# Patient Record
Sex: Female | Born: 1990 | Race: Black or African American | Hispanic: No | Marital: Single | Smoking: Never smoker
Health system: Southern US, Community
[De-identification: ages and names within clinical notes are randomized; demographics above are authoritative.]

## PROBLEM LIST (undated history)

## (undated) ENCOUNTER — Inpatient Hospital Stay (HOSPITAL_COMMUNITY): Payer: Self-pay

## (undated) DIAGNOSIS — D6800 Von Willebrand disease, unspecified: Secondary | ICD-10-CM

## (undated) HISTORY — PX: WISDOM TOOTH EXTRACTION: SHX21

---
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
## 2015-09-14 NOTE — L&D Delivery Note (Signed)
Operative Delivery Note At 4:55 PM a viable female was delivered via Vaginal, Vacuum Investment banker, operational(Extractor).  Presentation: vertex; Position: Occiput,, Anterior; Station: +2.  Verbal consent: obtained from patient.  Risks and benefits discussed in detail.  Risks include, but are not limited to the risks of anesthesia, bleeding, infection, damage to maternal tissues, fetal cephalhematoma.  There is also the risk of inability to effect vaginal delivery of the head, or shoulder dystocia that cannot be resolved by established maneuvers, leading to the need for emergency cesarean section... Vacuum was placed and head was delivered successfully  with 3 contractions. Pressure was released between contractions. No pop offs occurred.   APGAR: 8 and 9, ; weight  Pending .   Placenta status: intact , .   Cord:nuchal cord x1   with the following complications: None.  Cord pH: pending.  Anesthesia:  Epidural  Instruments: Kiwi Vacuum Extractor  Episiotomy: Right Mediolateral Lacerations: None Suture Repair: 3.0 vicryl Est. Blood Loss (mL): 250  Mom to postpartum.  Baby to Couplet care / Skin to Skin  Given patients diagnosis of Von Willebrands Disease plan to avoid circumcision until pediatrician has cleared infant for circumcision.  Cloma Rahrig J. 05/12/2016, 6:23 PM

## 2015-10-02 DIAGNOSIS — N898 Other specified noninflammatory disorders of vagina: Secondary | ICD-10-CM | POA: Diagnosis not present

## 2015-10-02 DIAGNOSIS — Z331 Pregnant state, incidental: Secondary | ICD-10-CM | POA: Diagnosis not present

## 2015-10-06 DIAGNOSIS — Z3401 Encounter for supervision of normal first pregnancy, first trimester: Secondary | ICD-10-CM | POA: Diagnosis not present

## 2015-10-06 LAB — OB RESULTS CONSOLE RUBELLA ANTIBODY, IGM: Rubella: IMMUNE

## 2015-10-06 LAB — OB RESULTS CONSOLE HIV ANTIBODY (ROUTINE TESTING): HIV: NONREACTIVE

## 2015-10-06 LAB — OB RESULTS CONSOLE ABO/RH: RH Type: POSITIVE

## 2015-10-06 LAB — OB RESULTS CONSOLE RPR: RPR: NONREACTIVE

## 2015-10-06 LAB — OB RESULTS CONSOLE HEPATITIS B SURFACE ANTIGEN: HEP B S AG: NEGATIVE

## 2015-10-06 LAB — OB RESULTS CONSOLE ANTIBODY SCREEN: Antibody Screen: NEGATIVE

## 2015-10-07 DIAGNOSIS — O26899 Other specified pregnancy related conditions, unspecified trimester: Secondary | ICD-10-CM | POA: Diagnosis not present

## 2015-10-07 DIAGNOSIS — Z3A08 8 weeks gestation of pregnancy: Secondary | ICD-10-CM | POA: Diagnosis not present

## 2015-10-21 ENCOUNTER — Other Ambulatory Visit (HOSPITAL_COMMUNITY)
Admission: RE | Admit: 2015-10-21 | Discharge: 2015-10-21 | Disposition: A | Discharge status: Home / Self Care | Payer: 59 | Source: Ambulatory Visit | Attending: Obstetrics and Gynecology | Admitting: Obstetrics and Gynecology

## 2015-10-21 ENCOUNTER — Other Ambulatory Visit: Payer: Self-pay | Admitting: Obstetrics and Gynecology

## 2015-10-21 DIAGNOSIS — Z01419 Encounter for gynecological examination (general) (routine) without abnormal findings: Secondary | ICD-10-CM | POA: Diagnosis not present

## 2015-10-21 DIAGNOSIS — Z113 Encounter for screening for infections with a predominantly sexual mode of transmission: Secondary | ICD-10-CM | POA: Insufficient documentation

## 2015-10-23 LAB — CYTOLOGY - PAP

## 2015-11-18 DIAGNOSIS — Z3402 Encounter for supervision of normal first pregnancy, second trimester: Secondary | ICD-10-CM | POA: Diagnosis not present

## 2015-12-15 ENCOUNTER — Emergency Department (HOSPITAL_BASED_OUTPATIENT_CLINIC_OR_DEPARTMENT_OTHER)
Admission: EM | Admit: 2015-12-15 | Discharge: 2015-12-15 | Disposition: A | Discharge status: Home / Self Care | Payer: 59 | Attending: Emergency Medicine | Admitting: Emergency Medicine

## 2015-12-15 ENCOUNTER — Encounter (HOSPITAL_BASED_OUTPATIENT_CLINIC_OR_DEPARTMENT_OTHER): Payer: Self-pay | Admitting: Emergency Medicine

## 2015-12-15 DIAGNOSIS — Z79899 Other long term (current) drug therapy: Secondary | ICD-10-CM | POA: Insufficient documentation

## 2015-12-15 DIAGNOSIS — O210 Mild hyperemesis gravidarum: Secondary | ICD-10-CM | POA: Insufficient documentation

## 2015-12-15 DIAGNOSIS — Z3A18 18 weeks gestation of pregnancy: Secondary | ICD-10-CM | POA: Insufficient documentation

## 2015-12-15 DIAGNOSIS — R112 Nausea with vomiting, unspecified: Secondary | ICD-10-CM | POA: Diagnosis not present

## 2015-12-15 DIAGNOSIS — R1013 Epigastric pain: Secondary | ICD-10-CM | POA: Insufficient documentation

## 2015-12-15 DIAGNOSIS — O9989 Other specified diseases and conditions complicating pregnancy, childbirth and the puerperium: Secondary | ICD-10-CM | POA: Diagnosis not present

## 2015-12-15 DIAGNOSIS — O219 Vomiting of pregnancy, unspecified: Secondary | ICD-10-CM | POA: Diagnosis present

## 2015-12-15 DIAGNOSIS — Z862 Personal history of diseases of the blood and blood-forming organs and certain disorders involving the immune mechanism: Secondary | ICD-10-CM | POA: Insufficient documentation

## 2015-12-15 DIAGNOSIS — R1110 Vomiting, unspecified: Secondary | ICD-10-CM

## 2015-12-15 HISTORY — DX: Von Willebrand's disease: D68.0

## 2015-12-15 HISTORY — DX: Von Willebrand disease, unspecified: D68.00

## 2015-12-15 LAB — URINALYSIS, ROUTINE W REFLEX MICROSCOPIC
Bilirubin Urine: NEGATIVE
GLUCOSE, UA: NEGATIVE mg/dL
HGB URINE DIPSTICK: NEGATIVE
Ketones, ur: 15 mg/dL — AB
Nitrite: NEGATIVE
Protein, ur: 30 mg/dL — AB
SPECIFIC GRAVITY, URINE: 1.027 (ref 1.005–1.030)
pH: 6 (ref 5.0–8.0)

## 2015-12-15 LAB — URINE MICROSCOPIC-ADD ON: RBC / HPF: NONE SEEN RBC/hpf (ref 0–5)

## 2015-12-15 MED ORDER — ONDANSETRON 4 MG PO TBDP
4.0000 mg | ORAL_TABLET | Freq: Once | ORAL | Status: AC
  Administered 2015-12-15: 4 mg via ORAL
  Filled 2015-12-15: qty 1

## 2015-12-15 MED ORDER — ONDANSETRON HCL 4 MG PO TABS: 4.0000 mg | ORAL_TABLET | Freq: Four times a day (QID) | ORAL | Status: DC

## 2015-12-15 NOTE — ED Notes (Signed)
MD at bedside. 

## 2015-12-15 NOTE — ED Notes (Signed)
abd ultrasound by EDP, FHR by EDP calculated at 136bpm

## 2015-12-15 NOTE — ED Notes (Signed)
Pt tolerating PO's well.  

## 2015-12-15 NOTE — ED Notes (Signed)
Pt resting quietly, states feels better after PO med

## 2015-12-15 NOTE — ED Notes (Signed)
Pt reports that she vomited x 5 since 5 am this morning, reports going to Children[facility] party yesterday and was fine prior to that

## 2015-12-15 NOTE — ED Provider Notes (Addendum)
CSN: [id number]     Arrival date & time 12/15/15  0856 History   First MD Initiated Contact with Patient 12/15/15 0913     Chief Complaint  Patient presents with  . Emesis     (Consider location/radiation/quality/duration/timing/severity/associated sxs/prior Treatment) Patient is a 25 y.o. female presenting with vomiting. The history is provided by the patient.  Emesis Severity:  Severe Duration:  4 hours Timing:  Intermittent Number of daily episodes:  5 Quality:  Stomach contents Progression:  Improving Chronicity:  New Recent urination:  Normal Relieved by:  None tried Worsened by:  Liquids and ice chips Ineffective treatments:  None tried Associated symptoms: abdominal pain   Associated symptoms: no cough, no diarrhea, no fever, no headaches, no sore throat and no URI   Associated symptoms comment:  Patient is currently [redacted] weeks pregnant with her first pregnancy. She did not have issues with morning sickness during her pregnancy. She felt fine yesterday but woke up at 5 AM this morning with vomiting every 30 minutes. Last episode of emesis was approximately 1 hour ago. She states after the vomiting started she started to have some abdominal soreness. Risk factors: pregnant now   Risk factors: no alcohol use, no diabetes, no prior abdominal surgery and no sick contacts     Past Medical History  Diagnosis Date  . Von Willebrand disease (HCC)    History reviewed. No pertinent past surgical history. History reviewed. No pertinent family history. Social History  Substance Use Topics  . Smoking status: Never Smoker   . Smokeless tobacco: None  . Alcohol Use: No   OB History    Gravida Para Term Preterm AB TAB SAB Ectopic Multiple Living   1              Review of Systems  HENT: Negative for sore throat.   Gastrointestinal: Positive for vomiting and abdominal pain. Negative for diarrhea.  Neurological: Negative for headaches.  All other systems reviewed and are  negative.     Allergies  Review of patient's allergies indicates no known allergies.  Home Medications   Prior to Admission medications   Medication Sig Start Date End Date Taking? Authorizing Provider  Prenatal Vit-Fe Fumarate-FA (MULTIVITAMIN-PRENATAL) 27-0.8 MG TABS tablet Take 1 tablet by mouth daily at 12 noon.   Yes Historical Provider, MD   BP 120/75 mmHg  Pulse 94  Temp(Src) 97.5 F (36.4 C) (Oral)  Resp 16  Ht 5\' 2"  (1.575 m)  Wt 125 lb (56.7 kg)  BMI 22.86 kg/m2  SpO2 100% Physical Exam  Constitutional: She is oriented to person, place, and time. She appears well-developed and well-nourished. No distress.  HENT:  Head: Normocephalic and atraumatic.  Mouth/Throat: Oropharynx is clear and moist.  Eyes: Conjunctivae and EOM are normal. Pupils are equal, round, and reactive to light.  Neck: Normal range of motion. Neck supple.  Cardiovascular: Normal rate, regular rhythm and intact distal pulses.   No murmur heard. Pulmonary/Chest: Effort normal and breath sounds normal. No respiratory distress. She has no wheezes. She has no rales.  Abdominal: Soft. She exhibits no distension. There is tenderness in the epigastric area and suprapubic area. There is no rebound and no guarding.  Gravid abdomen to above the pubic symphysis. No rebound or guarding but tenderness in the suprapubic and epigastric areas. abdomen is soft  Musculoskeletal: Normal range of motion. She exhibits no edema or tenderness.  Neurological: She is alert and oriented to person, place, and time.  Skin: Skin  is warm and dry. No rash noted. No erythema.  Psychiatric: She has a normal mood and affect. Her behavior is normal.  Nursing note and vitals reviewed.   ED Course  Procedures (including critical care time) Labs Review Labs Reviewed  URINALYSIS, ROUTINE W REFLEX MICROSCOPIC (NOT AT [facility]) - Abnormal; Notable for the following:    Color, Urine AMBER (*)    APPearance CLOUDY (*)    Ketones, ur 15  (*)    Protein, ur 30 (*)    Leukocytes, UA SMALL (*)    All other components within normal limits  URINE MICROSCOPIC-ADD ON - Abnormal; Notable for the following:    Squamous Epithelial / LPF 6-30 (*)    Bacteria, UA MANY (*)    All other components within normal limits    Imaging Review No results found. I have personally reviewed and evaluated these images and lab results as part of my medical decision-making.   EKG Interpretation None      MDM   Final diagnoses:  Intractable vomiting with nausea, vomiting of unspecified type    Patient is a healthy 25 year old female who is currently [redacted] weeks pregnant with a history of von Willebrand's disease presents today for 5 episodes of vomiting since 5 AM. She is still complaining of nausea now is having diffuse abdominal tenderness. She denies any urinary symptoms, vaginal discharge or bleeding, cough, shortness of breath. The vomiting started first and then the abdominal tenderness.  On exam bedside ultrasound shows a fetal heart rate of 136 with normal fetal movement. Patient has an appointment with her OB tomorrow for anatomy scan.  Vital signs and blood pressure within normal limits today. Feel most likely patient has a GI bug. However given the diffuse abdominal tenderness will check a UA as its most pronounced in the suprapubic region. Low suspicion for appendicitis, cholecystitis, pancreatitis or peritonitis at this time.  Patient given ODT Zofran and will orally rehydrate.  9:59 AM Patient's urine is contaminated but no obvious signs of infection.   10:49 AM Issues feeling much better after Zofran. Tolerating by mouth's and crackers. Will discharge home and has follow-up with her OB tomorrow.  Gwyneth Sprout, MD 12/15/15 1050  Gwyneth Sprout, MD 12/15/15 1051

## 2015-12-16 DIAGNOSIS — Z36 Encounter for antenatal screening of mother: Secondary | ICD-10-CM | POA: Diagnosis not present

## 2015-12-16 DIAGNOSIS — Z3402 Encounter for supervision of normal first pregnancy, second trimester: Secondary | ICD-10-CM | POA: Diagnosis not present

## 2016-02-12 DIAGNOSIS — Z3401 Encounter for supervision of normal first pregnancy, first trimester: Secondary | ICD-10-CM | POA: Diagnosis not present

## 2016-02-12 DIAGNOSIS — Z3402 Encounter for supervision of normal first pregnancy, second trimester: Secondary | ICD-10-CM | POA: Diagnosis not present

## 2016-03-24 DIAGNOSIS — Z3A32 32 weeks gestation of pregnancy: Secondary | ICD-10-CM | POA: Diagnosis not present

## 2016-03-24 DIAGNOSIS — O26843 Uterine size-date discrepancy, third trimester: Secondary | ICD-10-CM | POA: Diagnosis not present

## 2016-04-06 DIAGNOSIS — Z3403 Encounter for supervision of normal first pregnancy, third trimester: Secondary | ICD-10-CM | POA: Diagnosis not present

## 2016-04-06 DIAGNOSIS — Z23 Encounter for immunization: Secondary | ICD-10-CM | POA: Diagnosis not present

## 2016-04-22 DIAGNOSIS — Z3403 Encounter for supervision of normal first pregnancy, third trimester: Secondary | ICD-10-CM | POA: Diagnosis not present

## 2016-04-22 DIAGNOSIS — R030 Elevated blood-pressure reading, without diagnosis of hypertension: Secondary | ICD-10-CM | POA: Diagnosis not present

## 2016-04-22 DIAGNOSIS — O26843 Uterine size-date discrepancy, third trimester: Secondary | ICD-10-CM | POA: Diagnosis not present

## 2016-04-22 LAB — OB RESULTS CONSOLE GC/CHLAMYDIA
CHLAMYDIA, DNA PROBE: NEGATIVE
GC PROBE AMP, GENITAL: NEGATIVE

## 2016-04-22 LAB — OB RESULTS CONSOLE GBS: GBS: NEGATIVE

## 2016-04-27 DIAGNOSIS — Z3A37 37 weeks gestation of pregnancy: Secondary | ICD-10-CM | POA: Diagnosis not present

## 2016-04-27 DIAGNOSIS — O26843 Uterine size-date discrepancy, third trimester: Secondary | ICD-10-CM | POA: Diagnosis not present

## 2016-05-03 DIAGNOSIS — O368131 Decreased fetal movements, third trimester, fetus 1: Secondary | ICD-10-CM | POA: Diagnosis not present

## 2016-05-03 DIAGNOSIS — Z3403 Encounter for supervision of normal first pregnancy, third trimester: Secondary | ICD-10-CM | POA: Diagnosis not present

## 2016-05-11 ENCOUNTER — Encounter (HOSPITAL_COMMUNITY): Payer: Self-pay | Admitting: *Deleted

## 2016-05-11 ENCOUNTER — Inpatient Hospital Stay (HOSPITAL_COMMUNITY)
Admission: AD | Admit: 2016-05-11 | Discharge: 2016-05-14 | DRG: 775 | Disposition: A | Discharge status: Home / Self Care | Payer: 59 | Source: Ambulatory Visit | Attending: Obstetrics and Gynecology | Admitting: Obstetrics and Gynecology

## 2016-05-11 ENCOUNTER — Other Ambulatory Visit (HOSPITAL_COMMUNITY): Payer: Self-pay | Admitting: Obstetrics and Gynecology

## 2016-05-11 DIAGNOSIS — Z3A39 39 weeks gestation of pregnancy: Secondary | ICD-10-CM | POA: Diagnosis not present

## 2016-05-11 DIAGNOSIS — O134 Gestational [pregnancy-induced] hypertension without significant proteinuria, complicating childbirth: Principal | ICD-10-CM | POA: Diagnosis present

## 2016-05-11 DIAGNOSIS — O9912 Other diseases of the blood and blood-forming organs and certain disorders involving the immune mechanism complicating childbirth: Secondary | ICD-10-CM | POA: Diagnosis present

## 2016-05-11 DIAGNOSIS — O1490 Unspecified pre-eclampsia, unspecified trimester: Secondary | ICD-10-CM | POA: Diagnosis present

## 2016-05-11 DIAGNOSIS — D6800 Von Willebrand disease, unspecified: Secondary | ICD-10-CM | POA: Diagnosis present

## 2016-05-11 LAB — CBC
HEMATOCRIT: 43.8 % (ref 36.0–46.0)
Hemoglobin: 14.7 g/dL (ref 12.0–15.0)
MCH: 29.9 pg (ref 26.0–34.0)
MCHC: 33.6 g/dL (ref 30.0–36.0)
MCV: 89.2 fL (ref 78.0–100.0)
Platelets: 253 10*3/uL (ref 150–400)
RBC: 4.91 MIL/uL (ref 3.87–5.11)
RDW: 13.2 % (ref 11.5–15.5)
WBC: 8.7 10*3/uL (ref 4.0–10.5)

## 2016-05-11 LAB — FIBRINOGEN: Fibrinogen: 504 mg/dL — ABNORMAL HIGH (ref 210–475)

## 2016-05-11 LAB — TYPE AND SCREEN
ABO/RH(D): O POS
Antibody Screen: NEGATIVE

## 2016-05-11 LAB — COMPREHENSIVE METABOLIC PANEL WITH GFR
ALT: 19 U/L (ref 14–54)
AST: 24 U/L (ref 15–41)
Albumin: 4.3 g/dL (ref 3.5–5.0)
Alkaline Phosphatase: 170 U/L — ABNORMAL HIGH (ref 38–126)
Anion gap: 9 (ref 5–15)
BUN: 7 mg/dL (ref 6–20)
CO2: 23 mmol/L (ref 22–32)
Calcium: 10 mg/dL (ref 8.9–10.3)
Chloride: 104 mmol/L (ref 101–111)
Creatinine, Ser: 0.45 mg/dL (ref 0.44–1.00)
GFR calc Af Amer: >60 mL/min
GFR calc non Af Amer: >60 mL/min
Glucose, Bld: 92 mg/dL (ref 65–99)
Potassium: 4 mmol/L (ref 3.5–5.1)
Sodium: 136 mmol/L (ref 135–145)
Total Bilirubin: 1.8 mg/dL — ABNORMAL HIGH (ref 0.3–1.2)
Total Protein: 8.4 g/dL — ABNORMAL HIGH (ref 6.5–8.1)

## 2016-05-11 LAB — URIC ACID: URIC ACID, SERUM: 4.3 mg/dL (ref 2.3–6.6)

## 2016-05-11 LAB — PROTEIN / CREATININE RATIO, URINE
Creatinine, Urine: 70 mg/dL
PROTEIN CREATININE RATIO: 0.1 mg/mg{creat} (ref 0.00–0.15)
Total Protein, Urine: 7 mg/dL

## 2016-05-11 LAB — LACTATE DEHYDROGENASE: LDH: 183 U/L (ref 98–192)

## 2016-05-11 LAB — PROTIME-INR
INR: 0.82
Prothrombin Time: 11.3 seconds — ABNORMAL LOW (ref 11.4–15.2)

## 2016-05-11 LAB — APTT: aPTT: 32 s (ref 24–36)

## 2016-05-11 LAB — ABO/RH: ABO/RH(D): O POS

## 2016-05-11 MED ORDER — LIDOCAINE HCL (PF) 1 % IJ SOLN
30.0000 mL | INTRAMUSCULAR | Status: DC | PRN
  Filled 2016-05-11: qty 30

## 2016-05-11 MED ORDER — ONDANSETRON HCL 4 MG/2ML IJ SOLN
4.0000 mg | Freq: Four times a day (QID) | INTRAMUSCULAR | Status: DC | PRN
  Administered 2016-05-12: 4 mg via INTRAVENOUS
  Filled 2016-05-11: qty 2

## 2016-05-11 MED ORDER — OXYCODONE-ACETAMINOPHEN 5-325 MG PO TABS: 2.0000 | ORAL_TABLET | ORAL | Status: DC | PRN

## 2016-05-11 MED ORDER — ACETAMINOPHEN 325 MG PO TABS: 650.0000 mg | ORAL_TABLET | ORAL | Status: DC | PRN

## 2016-05-11 MED ORDER — LACTATED RINGERS IV SOLN
INTRAVENOUS | Status: DC
  Administered 2016-05-11 – 2016-05-12 (×2): via INTRAVENOUS

## 2016-05-11 MED ORDER — TERBUTALINE SULFATE 1 MG/ML IJ SOLN
0.2500 mg | Freq: Once | INTRAMUSCULAR | Status: DC | PRN
  Filled 2016-05-11: qty 1

## 2016-05-11 MED ORDER — FENTANYL CITRATE (PF) 100 MCG/2ML IJ SOLN: 50.0000 ug | INTRAMUSCULAR | Status: DC | PRN

## 2016-05-11 MED ORDER — SOD CITRATE-CITRIC ACID 500-334 MG/5ML PO SOLN: 30.0000 mL | ORAL | Status: DC | PRN

## 2016-05-11 MED ORDER — LACTATED RINGERS IV SOLN: 500.0000 mL | INTRAVENOUS | Status: DC | PRN

## 2016-05-11 MED ORDER — ZOLPIDEM TARTRATE 5 MG PO TABS: 5.0000 mg | ORAL_TABLET | Freq: Every evening | ORAL | Status: DC | PRN

## 2016-05-11 MED ORDER — OXYTOCIN 40 UNITS IN LACTATED RINGERS INFUSION - SIMPLE MED
2.5000 [IU]/h | INTRAVENOUS | Status: DC
  Administered 2016-05-12: 2.5 [IU]/h via INTRAVENOUS

## 2016-05-11 MED ORDER — OXYTOCIN 40 UNITS IN LACTATED RINGERS INFUSION - SIMPLE MED
1.0000 m[IU]/min | INTRAVENOUS | Status: DC
  Administered 2016-05-11: 1 m[IU]/min via INTRAVENOUS
  Filled 2016-05-11: qty 1000

## 2016-05-11 MED ORDER — OXYTOCIN BOLUS FROM INFUSION
500.0000 mL | Freq: Once | INTRAVENOUS | Status: AC
  Administered 2016-05-12: 500 mL via INTRAVENOUS

## 2016-05-11 MED ORDER — OXYCODONE-ACETAMINOPHEN 5-325 MG PO TABS: 1.0000 | ORAL_TABLET | ORAL | Status: DC | PRN

## 2016-05-11 NOTE — Anesthesia Pain Management Evaluation Note (Signed)
  CRNA Pain Management Visit Note  Patient: Karla [name], 25 y.o., female  "Hello I am a member of the anesthesia team at [facility]. We have an anesthesia team available at all times to provide care throughout the hospital, including epidural management and anesthesia for C-section. I don't know your plan for the delivery whether it a natural birth, water birth, IV sedation, nitrous supplementation, doula or epidural, but we want to meet your pain goals."   1.Was your pain managed to your expectations on prior hospitalizations?   No prior hospitalizations  2.What is your expectation for pain management during this hospitalization?     Epidural  3.How can we help you reach that goal? unsure  Record the patient's initial score and the patient's pain goal.   Pain: 0  Pain Goal: 6 [facility] wants you to be able to say your pain was always managed very well.  Cephus ShellingBURGER,Samarrah Tranchina 05/11/2016

## 2016-05-11 NOTE — Progress Notes (Addendum)
In to make the acquaintance of Karla [name], 25 yo G1P0 @ 39.1 wks admitted for preE w/o severe features.   Subjective: +FM. Denies h/a, visual changes, epigastric pain, difficulty breathing, ctxs, VB or LOF.  Objective: BP 134/83   Pulse 82   Temp 98.4 F (36.9 C) (Oral)   Resp 16   Ht 5\' 2"  (1.575 m)   Wt 70.8 kg (156 lb)   SpO2 100%   BMI 28.53 kg/m  No intake/output data recorded. No intake/output data recorded.  Today's Vitals   05/11/16 1849 05/11/16 1947 05/11/16 2004 05/11/16 2037  BP: 134/83     Pulse: 82     Resp:   16 16  Temp:   98.4 F (36.9 C)   TempSrc:   Oral   SpO2:      Weight:      Height:      PainSc: 0-No pain 0-No pain 0-No pain 0-No pain   Results for orders placed or performed during the hospital encounter of 05/11/16 (from the past 24 hour(s))  CBC     Status: None   Collection Time: 05/11/16  5:25 PM  Result Value Ref Range   WBC 8.7 4.0 - 10.5 K/uL   RBC 4.91 3.87 - 5.11 MIL/uL   Hemoglobin 14.7 12.0 - 15.0 g/dL   HCT 16.143.8 09.636.0 - 04.546.0 %   MCV 89.2 78.0 - 100.0 fL   MCH 29.9 26.0 - 34.0 pg   MCHC 33.6 30.0 - 36.0 g/dL   RDW 40.913.2 81.111.5 - 91.415.5 %   Platelets 253 150 - 400 K/uL  Comprehensive metabolic panel     Status: Abnormal   Collection Time: 05/11/16  5:25 PM  Result Value Ref Range   Sodium 136 135 - 145 mmol/L   Potassium 4.0 3.5 - 5.1 mmol/L   Chloride 104 101 - 111 mmol/L   CO2 23 22 - 32 mmol/L   Glucose, Bld 92 65 - 99 mg/dL   BUN 7 6 - 20 mg/dL   Creatinine, Ser 7.820.45 0.44 - 1.00 mg/dL   Calcium 95.610.0 8.9 - 21.310.3 mg/dL   Total Protein 8.4 (H) 6.5 - 8.1 g/dL   Albumin 4.3 3.5 - 5.0 g/dL   AST 24 15 - 41 U/L   ALT 19 14 - 54 U/L   Alkaline Phosphatase 170 (H) 38 - 126 U/L   Total Bilirubin 1.8 (H) 0.3 - 1.2 mg/dL   GFR calc non Af Amer >60 >60 mL/min   GFR calc Af Amer >60 >60 mL/min   Anion gap 9 5 - 15  Lactate dehydrogenase     Status: None   Collection Time: 05/11/16  5:25 PM  Result Value Ref Range   LDH 183 98 -  192 U/L  Type and screen     Status: None   Collection Time: 05/11/16  5:25 PM  Result Value Ref Range   ABO/RH(D) O POS    Antibody Screen NEG    Sample Expiration 05/14/2016   Uric acid     Status: None   Collection Time: 05/11/16  5:25 PM  Result Value Ref Range   Uric Acid, Serum 4.3 2.3 - 6.6 mg/dL  APTT     Status: None   Collection Time: 05/11/16  5:25 PM  Result Value Ref Range   aPTT 32 24 - 36 seconds  Protime-INR     Status: Abnormal   Collection Time: 05/11/16  5:25 PM  Result Value Ref Range  Prothrombin Time 11.3 (L) 11.4 - 15.2 seconds   INR 0.82   Fibrinogen     Status: Abnormal   Collection Time: 05/11/16  5:25 PM  Result Value Ref Range   Fibrinogen 504 (H) 210 - 475 mg/dL  ABO/Rh     Status: None   Collection Time: 05/11/16  5:25 PM  Result Value Ref Range   ABO/RH(D) O POS   Protein / creatinine ratio, urine     Status: None   Collection Time: 05/11/16  6:30 PM  Result Value Ref Range   Creatinine, Urine 70.00 mg/dL   Total Protein, Urine 7 mg/dL   Protein Creatinine Ratio 0.10 0.00 - 0.15 mg/mg[Cre]    FHT: BL 135 w/ moderate variability, +accels, no decels UC:   irregular SVE:   Dilation: 1.5 Effacement (%): 50 Station: -2 Exam by:: Venda Rodes, RN @ 1846 PM Pitocin at 2 mU/min  Spoke w/ Pharmacist Revonda Standard) @ 20:30 PM - DDAVP available in Pharmacy if needed.  Assessment:  IUP at term preE w/o severe features -- PCR on admission = 0.10 Type 1 von Willebrand disease Cat 1 FHRT GBS neg   Plan: Continue w/ original plan. Expect SVD.  Sherre Scarlet CNM 05/11/2016, 8:42 PM

## 2016-05-11 NOTE — H&P (Signed)
Karla [name] is a 25 y.o. female G1 P0 at 39 wks and 1 wk is sent to [facility] for induction due to preeclampsia. BP in the office today was 139/93 repeat 130/98 She had PCR last wk in the office that was 379.  She reports light menses prior to pregnancy She denies headache/ visual disturbances or ruq pain.  Pregnancy is complicated by Type 1 vonwillibrands Disease.  She uses STimate nasal spray prior to dental procedures.   OB History    Gravida Para Term Preterm AB Living   1             SAB TAB Ectopic Multiple Live Births           0     Past Medical History:  Diagnosis Date  . Von Willebrand disease (HCC)    No past surgical history on file. Family History: family history is not on file. Social History:  reports that she has never smoked. She does not have any smokeless tobacco history on file. She reports that she does not drink alcohol. Her drug history is not on file.     Maternal Diabetes: No Genetic Screening: Declined Maternal Ultrasounds/Referrals: Normal Fetal Ultrasounds or other Referrals:  None Maternal Substance Abuse:  No Significant Maternal Medications:  None Significant Maternal Lab Results:  Lab values include: Group B Strep negative Other Comments:  patient with Type 1 Von willebrands Disease   Review of Systems  Constitutional: Negative.   HENT: Negative.   Eyes: Negative.   Respiratory: Negative.   Cardiovascular: Negative.   Gastrointestinal: Negative.   Genitourinary: Negative.   Musculoskeletal: Negative.   Skin: Negative.   Neurological: Negative.   Endo/Heme/Allergies: Negative.   Psychiatric/Behavioral: Negative.    Maternal Medical History:  Contractions: Frequency: regular.   Perceived severity is mild.    Fetal activity: Perceived fetal activity is normal.   Last perceived fetal movement was within the past hour.    Prenatal complications: Pre-eclampsia.       Blood pressure 122/79, pulse 90, temperature 98.7 F (37.1  C), temperature source Oral, resp. rate 18, height 5\' 2"  (1.575 m), weight 156 lb (70.8 kg), SpO2 100 %. Maternal Exam:  Uterine Assessment: Contraction frequency is regular.   Abdomen: Patient reports no abdominal tenderness. Estimated fetal weight is 6 lbs 8 oz .   Fetal presentation: vertex  Introitus: Normal vulva. Normal vagina.  Pelvis: adequate for delivery.   Cervix: Cervix evaluated by digital exam.     Fetal Exam Fetal Monitor Review: Mode: fetoscope.   Baseline rate: 120.  Variability: moderate (6-25 bpm).   Pattern: accelerations present and no decelerations.    Fetal State Assessment: Category I - tracings are normal.     Physical Exam  Vitals reviewed. Constitutional: She is oriented to person, place, and time. She appears well-developed and well-nourished.  HENT:  Head: Normocephalic and atraumatic.  Eyes: Conjunctivae are normal. Pupils are equal, round, and reactive to light.  Neck: Normal range of motion.  Cardiovascular: Normal rate and regular rhythm.   Respiratory: Effort normal and breath sounds normal.  GI: There is no tenderness.  Genitourinary: Vagina normal.  Musculoskeletal: Normal range of motion. She exhibits no edema.  Neurological: She is alert and oriented to person, place, and time. She has normal reflexes.  Skin: Skin is warm and dry.  Psychiatric: She has a normal mood and affect.    Prenatal labs: ABO, Rh: --/--/O POS (08/29 1725) Antibody: PENDING (08/29 1725) Rubella: Immune (  01/23 0000) RPR: Nonreactive (01/23 0000)  HBsAg: Negative (01/23 0000)  HIV: Non-reactive (01/23 0000)  GBS: Negative (08/10 0000)   Assessment/Plan:  1) 39 wks and 1 day with preeclampsia without severe features.. Admit for induction. Given regular contractions plan pitocin low dose overnight and hold at 6 mU until in the morning. Anticipate SVD.   2) Von Willebrands Disease Type 1- Phone consult with Pt's hematologist Dr. Tia MaskerSusan Hines with [facility]. She  does not think this will be an issue during delivery. She recommends checking PTT which if normal is a good indication of Factor VIII activity. Per Dr. Eulah PontHines Stimate( DDAVP) nasal is not necessary prior to delivery and would only be necessary after delivery if she has severe bleeding.  3) High risk for PPH- discussed with patient r/o Transfusion in the event that blood is needed. Including r/o HIV / Hep  B&C.   Anesthesiology Consult Requested as pt desires epidural    Dr. Sallye OberKulwa with [facility] covering after 7pm.    Lurae Hornbrook J. 05/11/2016, 6:15 PM

## 2016-05-11 NOTE — Progress Notes (Signed)
Factor 8 lab result 133[facility]. Remainder of lab results will be available in 3 days.

## 2016-05-11 NOTE — Anesthesia Preprocedure Evaluation (Addendum)
Anesthesia Evaluation  Patient identified by MRN, date of birth, ID band Patient awake    Reviewed: Allergy & Precautions, NPO status , Patient's Chart, lab work & pertinent test results  History of Anesthesia Complications Negative for: history of anesthetic complications  Airway Mallampati: II  TM Distance: >3 FB Neck ROM: Full    Dental no notable dental hx. (+) Dental Advisory Given   Pulmonary neg pulmonary ROS,    Pulmonary exam normal        Cardiovascular hypertension, Normal cardiovascular exam     Neuro/Psych negative neurological ROS  negative psych ROS   GI/Hepatic negative GI ROS, Neg liver ROS,   Endo/Other  negative endocrine ROS  Renal/GU negative Renal ROS  negative genitourinary   Musculoskeletal negative musculoskeletal ROS (+)   Abdominal   Peds negative pediatric ROS (+)  Hematology  (+) FACTOR VIII (HEMOPHILIA) ,   Anesthesia Other Findings   Reproductive/Obstetrics (+) Pregnancy                             Anesthesia Physical Anesthesia Plan  ASA: III  Anesthesia Plan: Epidural   Post-op Pain Management:    Induction:   Airway Management Planned: Natural Airway  Additional Equipment:   Intra-op Plan:   Post-operative Plan:   Informed Consent: I have reviewed the patients History and Physical, chart, labs and discussed the procedure including the risks, benefits and alternatives for the proposed anesthesia with the patient or authorized representative who has indicated his/her understanding and acceptance.   Dental advisory given  Plan Discussed with: Anesthesiologist  Anesthesia Plan Comments: (Pt has documented normal VWF and Factor VII activity on 02/17/2016.  I am unable to recheck these because this lab result will not be available for several days.  I will proceed with epidural if platelet levels remain acceptable.)       Anesthesia Quick  Evaluation

## 2016-05-12 ENCOUNTER — Encounter (HOSPITAL_COMMUNITY): Payer: Self-pay

## 2016-05-12 ENCOUNTER — Inpatient Hospital Stay (HOSPITAL_COMMUNITY): Payer: 59 | Admitting: Anesthesiology

## 2016-05-12 LAB — FACTOR 8 ASSAY: Coagulation Factor VIII: 133 %

## 2016-05-12 LAB — CBC
HEMATOCRIT: 42.1 % (ref 36.0–46.0)
HEMATOCRIT: 35.7 % — AB (ref 36.0–46.0)
HEMOGLOBIN: 14.2 g/dL (ref 12.0–15.0)
HEMOGLOBIN: 11.9 g/dL — AB (ref 12.0–15.0)
MCH: 29.8 pg (ref 26.0–34.0)
MCH: 29.3 pg (ref 26.0–34.0)
MCHC: 33.7 g/dL (ref 30.0–36.0)
MCHC: 33.3 g/dL (ref 30.0–36.0)
MCV: 88.4 fL (ref 78.0–100.0)
MCV: 87.9 fL (ref 78.0–100.0)
Platelets: 220 10*3/uL (ref 150–400)
Platelets: 201 10*3/uL (ref 150–400)
RBC: 4.76 MIL/uL (ref 3.87–5.11)
RBC: 4.06 MIL/uL (ref 3.87–5.11)
RDW: 13.1 % (ref 11.5–15.5)
RDW: 13 % (ref 11.5–15.5)
WBC: 10.9 10*3/uL — AB (ref 4.0–10.5)
WBC: 13.3 10*3/uL — AB (ref 4.0–10.5)

## 2016-05-12 LAB — FIBRINOGEN
FIBRINOGEN: 386 mg/dL (ref 210–475)
Fibrinogen: 452 mg/dL (ref 210–475)

## 2016-05-12 LAB — APTT
aPTT: 32 seconds (ref 24–36)
aPTT: 31 seconds (ref 24–36)

## 2016-05-12 LAB — PROTIME-INR
INR: 0.9
INR: 0.96
PROTHROMBIN TIME: 12.2 s (ref 11.4–15.2)
PROTHROMBIN TIME: 12.8 s (ref 11.4–15.2)

## 2016-05-12 LAB — RPR: RPR: NONREACTIVE

## 2016-05-12 MED ORDER — ZOLPIDEM TARTRATE 5 MG PO TABS: 5.0000 mg | ORAL_TABLET | Freq: Every evening | ORAL | Status: DC | PRN

## 2016-05-12 MED ORDER — FENTANYL 2.5 MCG/ML BUPIVACAINE 1/10 % EPIDURAL INFUSION (WH - ANES)
14.0000 mL/h | INTRAMUSCULAR | Status: DC | PRN
  Administered 2016-05-12: 14 mL/h via EPIDURAL

## 2016-05-12 MED ORDER — MAGNESIUM HYDROXIDE 400 MG/5ML PO SUSP: 30.0000 mL | ORAL | Status: DC | PRN

## 2016-05-12 MED ORDER — PHENYLEPHRINE 40 MCG/ML (10ML) SYRINGE FOR IV PUSH (FOR BLOOD PRESSURE SUPPORT)
80.0000 ug | PREFILLED_SYRINGE | INTRAVENOUS | Status: DC | PRN
  Filled 2016-05-12: qty 5

## 2016-05-12 MED ORDER — LACTATED RINGERS IV SOLN: 500.0000 mL | Freq: Once | INTRAVENOUS | Status: DC

## 2016-05-12 MED ORDER — IBUPROFEN 600 MG PO TABS
600.0000 mg | ORAL_TABLET | Freq: Four times a day (QID) | ORAL | Status: DC
  Administered 2016-05-12 – 2016-05-14 (×7): 600 mg via ORAL
  Filled 2016-05-12 (×7): qty 1

## 2016-05-12 MED ORDER — OXYCODONE HCL 5 MG PO TABS
5.0000 mg | ORAL_TABLET | ORAL | Status: DC | PRN
  Administered 2016-05-13: 5 mg via ORAL
  Filled 2016-05-12: qty 1

## 2016-05-12 MED ORDER — PHENYLEPHRINE 40 MCG/ML (10ML) SYRINGE FOR IV PUSH (FOR BLOOD PRESSURE SUPPORT)
80.0000 ug | PREFILLED_SYRINGE | INTRAVENOUS | Status: DC | PRN
Start: 2016-05-12 — End: 2016-05-12
  Filled 2016-05-12: qty 5

## 2016-05-12 MED ORDER — DIPHENHYDRAMINE HCL 50 MG/ML IJ SOLN: 12.5000 mg | INTRAMUSCULAR | Status: DC | PRN

## 2016-05-12 MED ORDER — ACETAMINOPHEN 325 MG PO TABS
650.0000 mg | ORAL_TABLET | ORAL | Status: DC | PRN
  Administered 2016-05-14: 650 mg via ORAL
  Filled 2016-05-12: qty 2

## 2016-05-12 MED ORDER — OXYTOCIN 40 UNITS IN LACTATED RINGERS INFUSION - SIMPLE MED: 1.0000 m[IU]/min | INTRAVENOUS | Status: DC

## 2016-05-12 MED ORDER — DESMOPRESSIN ACETATE SPRAY 0.01 % NA SOLN
10.0000 ug | Freq: Two times a day (BID) | NASAL | Status: AC
  Administered 2016-05-12 (×2): 10 ug via NASAL
  Filled 2016-05-12: qty 5

## 2016-05-12 MED ORDER — DIPHENHYDRAMINE HCL 25 MG PO CAPS: 25.0000 mg | ORAL_CAPSULE | Freq: Four times a day (QID) | ORAL | Status: DC | PRN

## 2016-05-12 MED ORDER — EPHEDRINE 5 MG/ML INJ
10.0000 mg | INTRAVENOUS | Status: DC | PRN
Start: 2016-05-12 — End: 2016-05-12
  Filled 2016-05-12: qty 4

## 2016-05-12 MED ORDER — EPHEDRINE 5 MG/ML INJ
10.0000 mg | INTRAVENOUS | Status: DC | PRN
  Filled 2016-05-12: qty 4

## 2016-05-12 MED ORDER — LACTATED RINGERS IV SOLN
INTRAVENOUS | Status: DC
  Administered 2016-05-12: 13:00:00 via INTRAUTERINE

## 2016-05-12 MED ORDER — OXYCODONE HCL 5 MG PO TABS: 10.0000 mg | ORAL_TABLET | ORAL | Status: DC | PRN

## 2016-05-12 MED ORDER — LIDOCAINE HCL (PF) 1 % IJ SOLN
INTRAMUSCULAR | Status: DC | PRN
  Administered 2016-05-12 (×2): 5 mL

## 2016-05-12 MED ORDER — PHENYLEPHRINE 40 MCG/ML (10ML) SYRINGE FOR IV PUSH (FOR BLOOD PRESSURE SUPPORT)
80.0000 ug | PREFILLED_SYRINGE | INTRAVENOUS | Status: DC | PRN
  Filled 2016-05-12: qty 5
  Filled 2016-05-12: qty 10

## 2016-05-12 MED ORDER — PRENATAL MULTIVITAMIN CH
1.0000 | ORAL_TABLET | Freq: Every day | ORAL | Status: DC
  Administered 2016-05-13 – 2016-05-14 (×2): 1 via ORAL
  Filled 2016-05-12 (×2): qty 1

## 2016-05-12 MED ORDER — MISOPROSTOL 200 MCG PO TABS
ORAL_TABLET | ORAL | Status: AC
  Filled 2016-05-12: qty 5

## 2016-05-12 MED ORDER — WITCH HAZEL-GLYCERIN EX PADS
1.0000 "application " | MEDICATED_PAD | CUTANEOUS | Status: DC | PRN
  Administered 2016-05-14: 1 via TOPICAL

## 2016-05-12 MED ORDER — FERROUS SULFATE 325 (65 FE) MG PO TABS
325.0000 mg | ORAL_TABLET | Freq: Two times a day (BID) | ORAL | Status: DC
  Administered 2016-05-13 – 2016-05-14 (×2): 325 mg via ORAL
  Filled 2016-05-12 (×2): qty 1

## 2016-05-12 MED ORDER — COCONUT OIL OIL: 1.0000 "application " | TOPICAL_OIL | Status: DC | PRN

## 2016-05-12 MED ORDER — SENNOSIDES-DOCUSATE SODIUM 8.6-50 MG PO TABS
2.0000 | ORAL_TABLET | ORAL | Status: DC
  Administered 2016-05-12 – 2016-05-13 (×2): 2 via ORAL
  Filled 2016-05-12 (×2): qty 2

## 2016-05-12 MED ORDER — ONDANSETRON HCL 4 MG/2ML IJ SOLN: 4.0000 mg | INTRAMUSCULAR | Status: DC | PRN

## 2016-05-12 MED ORDER — FENTANYL 2.5 MCG/ML BUPIVACAINE 1/10 % EPIDURAL INFUSION (WH - ANES)
14.0000 mL/h | INTRAMUSCULAR | Status: DC | PRN
  Administered 2016-05-12: 14 mL/h via EPIDURAL
  Filled 2016-05-12 (×2): qty 125

## 2016-05-12 MED ORDER — BENZOCAINE-MENTHOL 20-0.5 % EX AERO
1.0000 "application " | INHALATION_SPRAY | CUTANEOUS | Status: DC | PRN
  Administered 2016-05-12 – 2016-05-14 (×2): 1 via TOPICAL
  Filled 2016-05-12 (×2): qty 56

## 2016-05-12 MED ORDER — ONDANSETRON HCL 4 MG PO TABS
4.0000 mg | ORAL_TABLET | ORAL | Status: DC | PRN
Start: 2016-05-12 — End: 2016-05-14

## 2016-05-12 MED ORDER — SIMETHICONE 80 MG PO CHEW: 80.0000 mg | CHEWABLE_TABLET | ORAL | Status: DC | PRN

## 2016-05-12 MED ORDER — DIBUCAINE 1 % RE OINT: 1.0000 "application " | TOPICAL_OINTMENT | RECTAL | Status: DC | PRN

## 2016-05-12 NOTE — Anesthesia Procedure Notes (Signed)
Epidural Patient location during procedure: OB Start time: 05/12/2016 9:15 AM End time: 05/12/2016 8:20 AM  Staffing Anesthesiologist: Bonita QuinGUIDETTI, Copeland Neisen S Performed: anesthesiologist   Preanesthetic Checklist Completed: patient identified, site marked, surgical consent, pre-op evaluation, timeout performed, IV checked, risks and benefits discussed and monitors and equipment checked  Epidural Patient position: sitting Prep: site prepped and draped and DuraPrep Patient monitoring: continuous pulse ox and blood pressure Approach: midline Location: L3-L4 Injection technique: LOR air  Needle:  Needle type: Tuohy  Needle gauge: 17 G Needle length: 9 cm and 9 Needle insertion depth: 5 cm cm Catheter type: closed end flexible Catheter size: 19 Gauge Catheter at skin depth: 12 cm Test dose: negative  Assessment Events: blood not aspirated, injection not painful, no injection resistance, negative IV test and no paresthesia

## 2016-05-12 NOTE — Progress Notes (Signed)
Karla [name] is a 25 y.o. G1P0 at 9746w2d  admitted for induction of labor due to gestational hypertension.  Subjective: Pt reports ROM with clear fluid at 705am. Contractions are more painful and she feels them approximately every minute.   Objective: BP 134/83   Pulse 82   Temp 98 F (36.7 C) (Oral)   Resp 16   Ht 5\' 2"  (1.575 m)   Wt 156 lb (70.8 kg)   SpO2 100%   BMI 28.53 kg/m  No intake/output data recorded. No intake/output data recorded.  FHT:  FHR: 120 bpm, variability: minimal ,  accelerations:  Present,  decelerations:  Absent UC:   Not graphing well  SVE:   Dilation: 2 Effacement (%): 50 Station: -2 Exam by:: L.Stubbs, RN  Labs: Lab Results  Component Value Date   WBC 8.7 05/11/2016   HGB 14.7 05/11/2016   HCT 43.8 05/11/2016   MCV 89.2 05/11/2016   PLT 253 05/11/2016    Assessment / Plan: 39 wks and 2 days with gestational hypertension. Based on elevated bp in the office . PCR here yesterday normal. pCR in the office elevated. Pt with normal range bp since admission. Karla [name] Disease Type1 ,.. Plan DDAVP now since ruptured and Again once she is completely dilated and prior to pushing.  Anticipate SVD.   Karla [name] J. 05/12/2016, 7:50 AM

## 2016-05-12 NOTE — Progress Notes (Signed)
Alsie Sarratt is a 25 y.o. G1P0 at 5180w2d by LMP admitted for induction of labor due to gestational hypertension .  Subjective: Pt report headache and feeling dizzy. Reports contractions are more painful   Objective: BP 115/73   Pulse 69   Temp 97.4 F (36.3 C) (Oral)   Resp 18   Ht 5\' 2"  (1.575 m)   Wt 156 lb (70.8 kg)   SpO2 100%   BMI 28.53 kg/m  No intake/output data recorded. No intake/output data recorded.  FHT:  FHR: 110 bpm, variability: moderate,  accelerations:  Abscent,  decelerations:  Present variable  UC:   regular, every 2 minutes SVE:   5/90/-1 IUPC and FSE placed.  Labs: Lab Results  Component Value Date   WBC 10.9 (H) 05/12/2016   HGB 14.2 05/12/2016   HCT 42.1 05/12/2016   MCV 88.4 05/12/2016   PLT 220 05/12/2016    Assessment / Plan: 39 wks and 2 days with gestational hypertension.  Repetitive variable decelerations categaory 2 tracing. Start amnioinfusion  Will monitor closely. Reevaluate in 30 minutes if repetitive deep variable persist plan for cesarean section.  Von Willebrands. Pt received DDAVP nasally this morning.  Aleathea Pugmire J. 05/12/2016, 1:12 PM

## 2016-05-12 NOTE — Anesthesia Rounding Note (Signed)
  CRNA Epidural Rounding Note  Patient: Karla [name], 25 y.o., female  Patient's current pain level: 3  Agreed upon pain management level: 4  Epidural intervention: No   Comments:   Shenicka Sunderlin 05/12/2016

## 2016-05-12 NOTE — Lactation Note (Signed)
This note was copied from a baby's chart. Lactation Consultation Note  Patient Name: Karla [name] Reason for consult: Initial assessment Baby at 4 hr of life. Mom is worried that baby is not latching. When lactation attempted to help mom with latch baby spit up x2. Discussed baby behavior, feeding frequency, baby belly size, voids, wt loss, breast changes, and nipple care. Demonstrated manual expression, colostrum noted bilaterally, spoon in room. Given lactation handouts. Aware of OP services and support group.     Maternal Data Has patient been taught Hand Expression?: Yes Does the patient have breastfeeding experience prior to this delivery?: No  Feeding Feeding Type: Breast Fed Length of feed: 0 min  LATCH Score/Interventions Latch: Too sleepy or reluctant, no latch achieved, no sucking elicited. Intervention(s): Skin to skin;Teach feeding cues;Waking techniques  Audible Swallowing: None Intervention(s): Skin to skin;Hand expression  Type of Nipple: Everted at rest and after stimulation  Comfort (Breast/Nipple): Soft / non-tender     Hold (Positioning): Full assist, staff holds infant at breast Intervention(s): Support Pillows;Position options  LATCH Score: 4  Lactation Tools Discussed/Used WIC Program: No   Consult Status Consult Status: Follow-up Date: 05/13/16 Follow-up type: In-patient    Karla [name] [name], 9:43 PM

## 2016-05-13 LAB — CBC
HCT: 30.7 % — ABNORMAL LOW (ref 36.0–46.0)
Hemoglobin: 10.3 g/dL — ABNORMAL LOW (ref 12.0–15.0)
MCH: 29.5 pg (ref 26.0–34.0)
MCHC: 33.6 g/dL (ref 30.0–36.0)
MCV: 88 fL (ref 78.0–100.0)
PLATELETS: 206 10*3/uL (ref 150–400)
RBC: 3.49 MIL/uL — AB (ref 3.87–5.11)
RDW: 13.2 % (ref 11.5–15.5)
WBC: 11.8 10*3/uL — AB (ref 4.0–10.5)

## 2016-05-13 LAB — CORD BLOOD GAS (ARTERIAL)

## 2016-05-13 LAB — APTT: APTT: 37 s — AB (ref 24–36)

## 2016-05-13 LAB — VON WILLEBRAND PANEL
COAGULATION FACTOR VIII: 187 % — AB (ref 57–163)
RISTOCETIN CO-FACTOR, PLASMA: 169 % (ref 50–200)
VON WILLEBRAND ANTIGEN, PLASMA: 257 % — AB (ref 50–200)

## 2016-05-13 LAB — COAG STUDIES INTERP REPORT: PDF IMAGE: 0

## 2016-05-13 NOTE — Anesthesia Postprocedure Evaluation (Signed)
Anesthesia Post Note  Patient: Karla [name]  Procedure(s) Performed: * No procedures listed *  Patient location during evaluation: Mother Baby Anesthesia Type: Epidural Level of consciousness: awake and alert and oriented Pain management: satisfactory to patient Vital Signs Assessment: post-procedure vital signs reviewed and stable Respiratory status: spontaneous breathing and nonlabored ventilation Cardiovascular status: stable Postop Assessment: no headache, no backache, no signs of nausea or vomiting, adequate PO intake and patient able to bend at knees (patient up walking) Anesthetic complications: no     Last Vitals:  Vitals:   05/13/16 0654 05/13/16 0805  BP: 121/69 116/68  Pulse: 82 87  Resp: 20 18  Temp: 36.7 C 36.6 C    Last Pain:  Vitals:   05/13/16 0805  TempSrc: Oral  PainSc:    Pain Goal: Patients Stated Pain Goal: 6 (05/11/16 1947)               Madison HickmanGREGORY,Jeananne Bedwell

## 2016-05-13 NOTE — Progress Notes (Signed)
Postpartum day #1, NSVD  Subjective Pt without complaints.  Lochia normal.  Pain controlled.  Breast feeding yes. Pt reports heavy bleeding but she has minimal blood on her pad this am.  Pt thinks baby has a lot of mucus he's gagging.  Denies baby is blue or in distress.  Temp:  [97.5 F (36.4 C)-98.1 F (36.7 C)] 97.8 F (36.6 C) (08/31 0805) Pulse Rate:  [69-102] 87 (08/31 0805) Resp:  [16-24] 18 (08/31 0805) BP: (103-158)/(68-141) 116/68 (08/31 0805) SpO2:  [99 %-100 %] 99 % (08/30 1940)  Gen:  NAD, A&O x 3 Uterine fundus:  Firm, nontender Lochia normal Ext:  Minimal Edema, no calf tenderness bilaterally Baby at bedside.  Pink and rooting which is what pt states he has been doing which she thought was gagging.  CBC    Component Value Date/Time   WBC 11.8 (H) 05/13/2016 0618   RBC 3.49 (L) 05/13/2016 0618   HGB 10.3 (L) 05/13/2016 0618   HCT 30.7 (L) 05/13/2016 0618   PLT 206 05/13/2016 0618   MCV 88.0 05/13/2016 0618   MCH 29.5 05/13/2016 0618   MCHC 33.6 05/13/2016 0618   RDW 13.2 05/13/2016 0618   PTT 37 (upper limit 36)   A/P: S/p SVD doing well. VMD Type 1.  No s/sxs of excessive bleeding.  PTT slightly elevated  Repeat PTT.  Will repeat this evening if bleeding picks up today.  O/w check in am. Routine postpartum care. Lactation support. Discharge in am pending hospital course. Pt desires inpatient circumcision.  Await pediatrician recommendations due to mother's h/o coagulopathy. Dr. [facility] Doppole to resume care at [facility]7am.    Makira Holleman, [facility] LPEVELYN 05/13/2016, 8:39 AM

## 2016-05-13 NOTE — Progress Notes (Signed)
Spoke with RN, Meriam SpragueBeverly.  Bleeding is still minimal. Order put in for PTT in am.

## 2016-05-13 NOTE — Lactation Note (Signed)
This note was copied from a baby's chart. Lactation Consultation Note  Patient Name: Karla [name]: 05/13/2016 Reason for consult: Follow-up assessment   Follow up with first time mom of 25 hour old infant. Infant with 7 attempts, 1 BF for 12 minutes, 1 spoon feeding of 1 cc EBM, 4 stools, 0 voids and 4 emesis episodes since birth. Diaper is currently clean and dry. LATCH Scores 4-7 by bedside RN's. Infant weight 6 lb 2.2 oz with 1% weight loss since birth. Infant is noted to have a cephalohematoma and his bilirubin is rising.   Mom is concerned that infant has not been feeding well. Mom reports he had a recent feeding of 12 minutes. Mom reports she is able to hand express and is seeing colostrum. Assisted mom in latching infant to left breast. Infant does not open wide and is noted to have tongue thrusting. Attempted to get infant to suckle on a gloved finger he is noted to have a high palate and is noted to tongue thrust, he did not suckle on gloved finger. He had difficulty latching to right breast, the right nipple is noted to be thicker and less compressible than the left nipple. Infant was sleepy with attempting to latch. Mom was able to hand express 2 cc to infant via spoon, he then latched to left breast after several attempts. Once latched he was noted to have flanged lips and rhythmic suckling with frequent swallows. Enc mom to compress/massage breast with feeding. Infant fed for 20 minutes and self detached. He was content after feeding.  Set up DEBP with instruction to set up, assemble, disassemble, and clean pump parts. Mom then pumped for 15 minutes on Initiate setting and received 2 cc colostrum from left breast. Showed dad how to spoon feed infant and he returned demonstration. We then hand expressed mom and received another 2 cc colostrum that was saved for next feeding as infant was now asleep.  Plan made with parents:  Breastfeed infant at breast at least every 3  hours, awaken at 3 hours if needed Supplement infant with and available EBM via spoon Pump both breasts for 15 minutes with DEBP on Initiate setting Hand express both breast post pumping and spoon feed at next feeding Limit stimulation to infant between feedings Call for assistance with latch as needed Follow up tomorrow and prn B. Antony Maduraaly, RN and Alyssa RN updated on plan at bedside  Maternal Data Formula Feeding for Exclusion: No Has patient been taught Hand Expression?: Yes Does the patient have breastfeeding experience prior to this delivery?: No  Feeding Feeding Type: Breast Fed Length of feed: 20 min  LATCH Score/Interventions Latch: Grasps breast easily, tongue down, lips flanged, rhythmical sucking. Intervention(s): Skin to skin;Teach feeding cues;Waking techniques Intervention(s): Adjust position;Assist with latch;Breast massage;Breast compression  Audible Swallowing: Spontaneous and intermittent Intervention(s): Hand expression Intervention(s): Alternate breast massage  Type of Nipple: Everted at rest and after stimulation  Comfort (Breast/Nipple): Soft / non-tender     Hold (Positioning): Assistance needed to correctly position infant at breast and maintain latch. Intervention(s): Breastfeeding basics reviewed;Support Pillows;Position options;Skin to skin  LATCH Score: 9  Lactation Tools Discussed/Used Tools: Pump Breast pump type: Double-Electric Breast Pump WIC Program: No Pump Review: Setup, frequency, and cleaning;Milk Storage Initiated by:: Karla StainSharon Abdullah Rizzi, RN IBCLC [name] initiated:: 05/14/16   Consult Status Consult Status: Follow-up [name]: 05/14/16 Follow-up type: In-patient    Karla [name] 05/13/2016, 7:50 PM

## 2016-05-14 DIAGNOSIS — D6800 Von Willebrand disease, unspecified: Secondary | ICD-10-CM | POA: Diagnosis present

## 2016-05-14 LAB — APTT: APTT: 31 s (ref 24–36)

## 2016-05-14 LAB — PROTIME-INR
INR: 0.94
PROTHROMBIN TIME: 12.6 s (ref 11.4–15.2)

## 2016-05-14 MED ORDER — IBUPROFEN 600 MG PO TABS: 600.0000 mg | ORAL_TABLET | Freq: Four times a day (QID) | ORAL | 1 refills | Status: DC | PRN

## 2016-05-14 MED ORDER — DESMOPRESSIN ACETATE 1.5 MG/ML NA SOLN: 2.0000 | Freq: Two times a day (BID) | NASAL | 1 refills | Status: DC | PRN

## 2016-05-14 NOTE — Lactation Note (Addendum)
This note was copied from a baby's chart. Lactation Consultation Note  Baby 5042 hours old and per parents baby has been sleepy at the breast. IBCLC left phone number for parents to call to assist w/ next feeding. Baby was recently supplemented with formula with bottle. Mother had approx 5 ml of recently pumped breastmilk on bedside table. Suggest giving the pumped breastmilk before offering formula. Reviewed supplemental volume guidelines with parents 7-12 ml for age.  Suggest giving breastmilk first and then difference with Alimentum. Parents agreeable. Encouraged pumping w/ DEBP. Parents also request UMR breast pump.     Patient Name: Karla [name]     Maternal Data    Feeding Feeding Type: Breast Fed Length of feed: 9 min  LATCH Score/Interventions Latch:  (instructed mother to call for next latch)                    Lactation Tools Discussed/Used     Consult Status      Hardie PulleyBerkelhammer, Ruth [name], 11:34 AM

## 2016-05-14 NOTE — Discharge Summary (Signed)
OB Discharge Summary     Patient Name: Karla [name] DOB: 1991-01-21 MRN: [id number]  Date of admission: 05/11/2016 Delivering MD: Gerald LeitzOLE, Lawanda Holzheimer   Date of discharge: 05/14/2016  Admitting diagnosis: induction Intrauterine pregnancy: 6625w2d     Secondary diagnosis:  Principal Problem:   Vacuum extraction, delivered, current hospitalization Active Problems:   Preeclampsia   Von Willebrand's disease [facility](HCC)  Additional problems: None     Discharge diagnosis: Term Pregnancy Delivered and Gestational Hypertension                                                                                                Post partum procedures:None  Augmentation: Pitocin  Complications: None  Hospital course:  Induction of Labor With Vaginal Delivery   25 y.o. yo G1P1001 at 8025w2d was admitted to the hospital 05/11/2016 for induction of labor.  Indication for induction: Gestational hypertension.  Patient had an uncomplicated labor course as follows: Membrane Rupture Time/Date: 7:05 AM ,05/12/2016   Intrapartum Procedures: Episiotomy: Right Mediolateral [4]                                         Lacerations:  None [1]  Patient had delivery of a Non Viable infant.  Information for the patient's newborn:  Bryson CoronaChilombo, Boy Amee [[id number]][[id number]]  Delivery Method: Vaginal, Vacuum (Extractor) (Filed from Delivery Summary)   05/12/2016  Details of delivery can be found in separate delivery note.  Patient had a routine postpartum course. Patient is discharged home 05/14/16.   Physical exam Vitals:   05/13/16 0654 05/13/16 0805 05/13/16 1813 05/14/16 0630  BP: 121/69 116/68 129/78 125/75  Pulse: 82 87 81 69  Resp: 20 18 18 18   Temp: 98.1 F (36.7 C) 97.8 F (36.6 C) 97.7 F (36.5 C) 98.4 F (36.9 C)  TempSrc: Oral Oral Oral Oral  SpO2:      Weight:      Height:       General: alert, cooperative and no distress Lochia: appropriate Uterine Fundus: firm Incision: N/A DVT Evaluation: No evidence of DVT  seen on physical exam. Labs: Lab Results  Component Value Date   WBC 11.8 (H) 05/13/2016   HGB 10.3 (L) 05/13/2016   HCT 30.7 (L) 05/13/2016   MCV 88.0 05/13/2016   PLT 206 05/13/2016   CMP Latest Ref Rng & Units 05/11/2016  Glucose 65 - 99 mg/dL 92  BUN 6 - 20 mg/dL 7  Creatinine 0.860.44 - 5.781.00 mg/dL 4.690.45  Sodium 629135 - 528145 mmol/L 136  Potassium 3.5 - 5.1 mmol/L 4.0  Chloride 101 - 111 mmol/L 104  CO2 22 - 32 mmol/L 23  Calcium 8.9 - 10.3 mg/dL 41.310.0  Total Protein 6.5 - 8.1 g/dL 2.4(M8.4(H)  Total Bilirubin 0.3 - 1.2 mg/dL 0.1(U1.8(H)  Alkaline Phos 38 - 126 U/L 170(H)  AST 15 - 41 U/L 24  ALT 14 - 54 U/L 19    Discharge instruction: per After Visit Summary and "Baby and Me Booklet".  After visit meds:  Medication List    TAKE these medications   desmopressin 1.5 MG/ML Soln Commonly known as:  STIMATE Place 2 sprays (0.3 mg total) into the nose every 12 (twelve) hours as needed (Heavy Bleeding).   ibuprofen 600 MG tablet Commonly known as:  ADVIL,MOTRIN Take 1 tablet (600 mg total) by mouth every 6 (six) hours as needed.   multivitamin-prenatal 27-0.8 MG Tabs tablet Take 1 tablet by mouth daily at 12 noon.   ondansetron 4 MG tablet Commonly known as:  ZOFRAN Take 1 tablet (4 mg total) by mouth every 6 (six) hours.       Diet: routine diet  Activity: Advance as tolerated. Pelvic rest for 6 weeks.   Outpatient follow up:2 weeks Follow up Appt:No future appointments. Follow up Visit:No Follow-up on file.  Postpartum contraception: Not Discussed  Newborn Data: Live born female  Birth Weight: 6 lb 3.1 oz (2810 g) APGAR: 8, 9  Baby Feeding: Breast Disposition:home with mother   05/14/2016 Jessee Avers., MD

## 2016-05-14 NOTE — Progress Notes (Signed)
Post Partum Day 2 SVD Subjective: no complaints, up ad lib, voiding and tolerating PO  Objective: Blood pressure 125/75, pulse 69, temperature 98.4 F (36.9 C), temperature source Oral, resp. rate 18, height 5\' 2"  (1.575 m), weight 156 lb (70.8 kg), SpO2 99 %, unknown if currently breastfeeding.  Physical Exam:  General: alert and cooperative Lochia: appropriate Uterine Fundus: firm Incision: NA DVT Evaluation: No evidence of DVT seen on physical exam.   Recent Labs  05/12/16 2056 05/13/16 0618  HGB 11.9* 10.3*  HCT 35.7* 30.7*    Assessment/Plan: Discharge home, Breastfeeding and Circumcision prior to discharge   D/W pt r/o Circumcision including increased r/o bleeding given pt diagnosis of vonwillebrands disease. Pt voiced understanding and desires to proceed with circumcision.  Von wilebrands disease. Lochia is minimal PTT normal this am.. Plan d/c home with stimate to use every 12hrs as needed if heavy bleeding occurs. Pt advised to call office or go to ED  If heavy bleeding does not respond to Stimate (DDAVP) Follow up in the office in 2 wks    LOS: 3 days   Anais Koenen J. 05/14/2016, 1:08 PM

## 2016-05-15 ENCOUNTER — Ambulatory Visit: Payer: Self-pay

## 2016-05-15 NOTE — Lactation Note (Signed)
This note was copied from a baby's chart. Lactation Consultation Note  Patient Name: Karla [name] Reason for consult: Follow-up assessment    With this mom and term baby, now 266 hours old, and just under 6 pounds. Mom was able to pump almost 3 ounces of transitional milk this morning. Dad was feeding baby when I walked in the room. I shoed parent how to side lie the baby when bottle feeding. Mom knows she will no longer need  supplement with formula. I reviewed EBm storage guidelines with mom, and mom knows to call lactation for questions/concerns. Mom denies any nipple pain with latch, and states the baby latches deeply.I noted that when bottle feeding, the baby has a thick upper lip frenulum that extends to the gum line, and his tongue falls far behind his gum line at rest, and with extention, forms a point.   Maternal Data    Feeding    LATCH Score/Interventions                      Lactation Tools Discussed/Used Pump Review:  (mom told to pump until comfort, if breast feeding, or until she stops dripping, if bottle feeding. )   Consult Status Consult Status: Complete Follow-up type: Call as needed    Karla [name], Karla [name] [name], 10:57 AM

## 2017-08-24 LAB — OB RESULTS CONSOLE HIV ANTIBODY (ROUTINE TESTING): HIV: NONREACTIVE

## 2017-08-24 LAB — OB RESULTS CONSOLE RPR: RPR: NONREACTIVE

## 2017-08-24 LAB — OB RESULTS CONSOLE HEPATITIS B SURFACE ANTIGEN: HEP B S AG: NEGATIVE

## 2017-08-24 LAB — OB RESULTS CONSOLE RUBELLA ANTIBODY, IGM: Rubella: IMMUNE

## 2017-09-13 NOTE — L&D Delivery Note (Signed)
Delivery Note At 8:34 PM a viable female was delivered via Vaginal, Spontaneous (Presentation: occiput anterior ;  ).  APGAR: 9, 9; weight  .   Placenta status:complete 3 vessel cord . No complications  .  Cord pH: NA  Anesthesia:  Epidural  Episiotomy: None Lacerations: 1st degree;Perineal Suture Repair: 3.0 vicryl Est. Blood Loss (mL): 100  Mom to postpartum.  Baby to Couplet care / Skin to Skin.  Alika Eppes J. 03/27/2018, 8:54 PM

## 2017-09-26 ENCOUNTER — Other Ambulatory Visit (HOSPITAL_COMMUNITY)
Admission: RE | Admit: 2017-09-26 | Discharge: 2017-09-26 | Disposition: A | Discharge status: Home / Self Care | Payer: Medicaid Other | Source: Ambulatory Visit | Attending: Obstetrics and Gynecology | Admitting: Obstetrics and Gynecology

## 2017-09-26 ENCOUNTER — Other Ambulatory Visit: Payer: Self-pay | Admitting: Obstetrics and Gynecology

## 2017-09-26 DIAGNOSIS — Z124 Encounter for screening for malignant neoplasm of cervix: Secondary | ICD-10-CM | POA: Diagnosis not present

## 2017-09-26 LAB — OB RESULTS CONSOLE GC/CHLAMYDIA
Chlamydia: NEGATIVE
Chlamydia: NEGATIVE
GC PROBE AMP, GENITAL: NEGATIVE

## 2017-09-27 LAB — CYTOLOGY - PAP
CHLAMYDIA, DNA PROBE: NEGATIVE
Diagnosis: NEGATIVE
Neisseria Gonorrhea: NEGATIVE

## 2017-12-29 LAB — OB RESULTS CONSOLE RPR: RPR: NONREACTIVE

## 2018-01-23 ENCOUNTER — Encounter (HOSPITAL_COMMUNITY): Payer: Self-pay | Admitting: *Deleted

## 2018-01-23 ENCOUNTER — Inpatient Hospital Stay (HOSPITAL_COMMUNITY)
Admission: AD | Admit: 2018-01-23 | Discharge: 2018-01-23 | Disposition: A | Discharge status: Home / Self Care | Payer: 59 | Source: Ambulatory Visit | Attending: Obstetrics and Gynecology | Admitting: Obstetrics and Gynecology

## 2018-01-23 DIAGNOSIS — R42 Dizziness and giddiness: Secondary | ICD-10-CM | POA: Insufficient documentation

## 2018-01-23 DIAGNOSIS — O26893 Other specified pregnancy related conditions, third trimester: Secondary | ICD-10-CM | POA: Diagnosis not present

## 2018-01-23 DIAGNOSIS — O9989 Other specified diseases and conditions complicating pregnancy, childbirth and the puerperium: Secondary | ICD-10-CM | POA: Diagnosis not present

## 2018-01-23 DIAGNOSIS — Z3A30 30 weeks gestation of pregnancy: Secondary | ICD-10-CM | POA: Insufficient documentation

## 2018-01-23 LAB — BASIC METABOLIC PANEL
Anion gap: 10 (ref 5–15)
BUN: 13 mg/dL (ref 6–20)
CALCIUM: 8.6 mg/dL — AB (ref 8.9–10.3)
CHLORIDE: 104 mmol/L (ref 101–111)
CO2: 21 mmol/L — AB (ref 22–32)
CREATININE: 0.47 mg/dL (ref 0.44–1.00)
GFR calc Af Amer: >60 mL/min (ref >60)
GFR calc non Af Amer: >60 mL/min (ref >60)
Glucose, Bld: 86 mg/dL (ref 65–99)
Potassium: 3.3 mmol/L — ABNORMAL LOW (ref 3.5–5.1)
Sodium: 135 mmol/L (ref 135–145)

## 2018-01-23 LAB — URINALYSIS, ROUTINE W REFLEX MICROSCOPIC
Bilirubin Urine: NEGATIVE
Glucose, UA: NEGATIVE mg/dL
Hgb urine dipstick: NEGATIVE
Ketones, ur: NEGATIVE mg/dL
Nitrite: NEGATIVE
PROTEIN: NEGATIVE mg/dL
Specific Gravity, Urine: 1.027 (ref 1.005–1.030)
pH: 5 (ref 5.0–8.0)

## 2018-01-23 LAB — CBC
HCT: 36.8 % (ref 36.0–46.0)
HEMOGLOBIN: 12.1 g/dL (ref 12.0–15.0)
MCH: 29.8 pg (ref 26.0–34.0)
MCHC: 32.9 g/dL (ref 30.0–36.0)
MCV: 90.6 fL (ref 78.0–100.0)
Platelets: 196 10*3/uL (ref 150–400)
RBC: 4.06 MIL/uL (ref 3.87–5.11)
RDW: 12.6 % (ref 11.5–15.5)
WBC: 7.8 10*3/uL (ref 4.0–10.5)

## 2018-01-23 LAB — GLUCOSE, CAPILLARY: GLUCOSE-CAPILLARY: 100 mg/dL — AB (ref 65–99)

## 2018-01-23 NOTE — MAU Note (Signed)
Pt reports this am she has felt really light-headed and like she is going to pass out . Feels out of breath and nauseated.

## 2018-01-23 NOTE — Discharge Instructions (Signed)
If your dizziness continues with position changes, you can get meclizine (available over the counter) and take per package instructions.    Dizziness Dizziness is a common problem. It makes you feel unsteady or light-headed. You may feel like you are about to pass out (faint). Dizziness can lead to getting hurt if you stumble or fall. Dizziness can be caused by many things, including:  Medicines.  Not having enough water in your body (dehydration).  Illness.  Follow these instructions at home: Eating and drinking  Drink enough fluid to keep your pee (urine) clear or pale yellow. This helps to keep you from getting dehydrated. Try to drink more clear fluids, such as water.  Do not drink alcohol.  Limit how much caffeine you drink or eat, if your doctor tells you to do that.  Limit how much salt (sodium) you drink or eat, if your doctor tells you to do that. Activity  Avoid making quick movements. ? When you stand up from sitting in a chair, steady yourself until you feel okay. ? In the morning, first sit up on the side of the bed. When you feel okay, stand slowly while you hold onto something. Do this until you know that your balance is fine.  If you need to stand in one place for a long time, move your legs often. Tighten and relax the muscles in your legs while you are standing.  Do not drive or use heavy machinery if you feel dizzy.  Avoid bending down if you feel dizzy. Place items in your home so you can reach them easily without leaning over. Lifestyle  Do not use any products that contain nicotine or tobacco, such as cigarettes and e-cigarettes. If you need help quitting, ask your doctor.  Try to lower your stress level. You can do this by using methods such as yoga or meditation. Talk with your doctor if you need help. General instructions  Watch your dizziness for any changes.  Take over-the-counter and prescription medicines only as told by your doctor. Talk with  your doctor if you think that you are dizzy because of a medicine that you are taking.  Tell a friend or a family member that you are feeling dizzy. If he or she notices any changes in your behavior, have this person call your doctor.  Keep all follow-up visits as told by your doctor. This is important. Contact a doctor if:  Your dizziness does not go away.  Your dizziness or light-headedness gets worse.  You feel sick to your stomach (nauseous).  You have trouble hearing.  You have new symptoms.  You are unsteady on your feet.  You feel like the room is spinning. Get help right away if:  You throw up (vomit) or have watery poop (diarrhea), and you cannot eat or drink anything.  You have trouble: ? Talking. ? Walking. ? Swallowing. ? Using your arms, hands, or legs.  You feel generally weak.  You are not thinking clearly, or you have trouble forming sentences. A friend or family member may notice this.  You have: ? Chest pain. ? Pain in your belly (abdomen). ? Shortness of breath. ? Sweating.  Your vision changes.  You are bleeding.  You have a very bad headache.  You have neck pain or a stiff neck.  You have a fever. These symptoms may be an emergency. Do not wait to see if the symptoms will go away. Get medical help right away. Call your local emergency  services (911 in the [place] Do not drive yourself to the hospital. Summary  Dizziness makes you feel unsteady or light-headed. You may feel like you are about to pass out (faint).  Drink enough fluid to keep your pee (urine) clear or pale yellow. Do not drink alcohol.  Avoid making quick movements if you feel dizzy.  Watch your dizziness for any changes. This information is not intended to replace advice given to you by your health care provider. Make sure you discuss any questions you have with your health care provider. Document Released: 08/19/2011 Document Revised: 09/16/2016 Document Reviewed:  09/16/2016 Elsevier Interactive Patient Education  2017 [employer].

## 2018-01-23 NOTE — MAU Provider Note (Signed)
History     CSN: [id number]  Arrival date and time: 01/23/18 1130   First Provider Initiated Contact with Patient 01/23/18 1214      Chief Complaint  Patient presents with  . Dizziness  . Shortness of Breath  . Nausea   HPI Karla [name] is a 27 y.o. G2P1001 at [redacted]w[redacted]d who presents with episode of presyncope. Reports while at work this morning she felt lightheaded, SOB, & nauseated; felt like she was going to pass out. States she feels like these symptoms only come on when she's at work b/c she has to walk around a lot and go inside & outside multiple times during the day (she works at [employer]). Reports 1 episode this morning while at work. Last time this happened was the beginning of the pregnancy. No symptoms since leaving work. Symptoms improved with sitting down. No LOC. Denies chest pain or palpitations. No abdominal pain, LOF, or vaginal bleeding. Positive fetal movement. States she had a bagel sandwich this morning, some water, and a bottle of juice.   OB History    Gravida  2   Para  1   Term  1   Preterm      AB      Living  1     SAB      TAB      Ectopic      Multiple  0   Live Births  1           Past Medical History:  Diagnosis Date  . Von Willebrand disease (HCC)     Past Surgical History:  Procedure Laterality Date  . WISDOM TOOTH EXTRACTION Bilateral     History reviewed. No pertinent family history.  Social History   Tobacco Use  . Smoking status: Never Smoker  . Smokeless tobacco: Never Used  Substance Use Topics  . Alcohol use: No  . Drug use: No    Allergies: No Known Allergies  Medications Prior to Admission  Medication Sig Dispense Refill Last Dose  . desmopressin (STIMATE) 1.5 MG/ML SOLN Place 2 sprays (0.3 mg total) into the nose every 12 (twelve) hours as needed (Heavy Bleeding). 2.5 mL 1   . ibuprofen (ADVIL,MOTRIN) 600 MG tablet Take 1 tablet (600 mg total) by mouth every 6 (six) hours as needed. 30 tablet 1   .  ondansetron (ZOFRAN) 4 MG tablet Take 1 tablet (4 mg total) by mouth every 6 (six) hours. (Patient not taking: Reported on 05/11/2016) 6 tablet 0 Not Taking at Unknown time  . Prenatal Vit-Fe Fumarate-FA (MULTIVITAMIN-PRENATAL) 27-0.8 MG TABS tablet Take 1 tablet by mouth daily at 12 noon.   05/10/2016 at Unknown time    Review of Systems  Constitutional: Negative.   Respiratory: Positive for shortness of breath (only during episode, did not continue).   Cardiovascular: Negative for chest pain and palpitations.  Gastrointestinal: Negative.   Genitourinary: Negative.   Neurological: Positive for dizziness and light-headedness. Negative for syncope and headaches.   Physical Exam   Blood pressure 103/63, pulse 93, temperature 98.4 F (36.9 C), temperature source Oral, resp. rate 17, height  (1.575 m), weight 138 lb (62.6 kg), SpO2 100 %, unknown if currently breastfeeding. Orthostatic VS for the past 24 hrs:  BP- Lying Pulse- Lying BP- Sitting Pulse- Sitting BP- Standing at 0 minutes Pulse- Standing at 0 minutes  01/23/18 1225 114/68 93 119/66 96 108/59 99     Physical Exam  Nursing note and vitals reviewed. Constitutional: She  is oriented to person, place, and time. She appears well-developed and well-nourished. No distress.  HENT:  Head: Normocephalic and atraumatic.  Eyes: Conjunctivae are normal. Right eye exhibits no discharge. Left eye exhibits no discharge. No scleral icterus.  Neck: Normal range of motion.  Cardiovascular: Normal rate, regular rhythm and normal heart sounds.  No murmur heard. Respiratory: Effort normal and breath sounds normal. No respiratory distress. She has no wheezes.  GI: Soft. Bowel sounds are normal. There is no tenderness.  Neurological: She is alert and oriented to person, place, and time.  Skin: Skin is warm and dry. She is not diaphoretic.  Psychiatric: She has a normal mood and affect. Her behavior is normal. Judgment and thought content normal.     MAU Course  Procedures Results for orders placed or performed during the hospital encounter of 01/23/18 (from the past 24 hour(s))  Urinalysis, Routine w reflex microscopic     Status: Abnormal   Collection Time: 01/23/18 11:45 AM  Result Value Ref Range   Color, Urine YELLOW YELLOW   APPearance HAZY (A) CLEAR   Specific Gravity, Urine 1.027 1.005 - 1.030   pH 5.0 5.0 - 8.0   Glucose, UA NEGATIVE NEGATIVE mg/dL   Hgb urine dipstick NEGATIVE NEGATIVE   Bilirubin Urine NEGATIVE NEGATIVE   Ketones, ur NEGATIVE NEGATIVE mg/dL   Protein, ur NEGATIVE NEGATIVE mg/dL   Nitrite NEGATIVE NEGATIVE   Leukocytes, UA TRACE (A) NEGATIVE   RBC / HPF 0-5 0 - 5 RBC/hpf   WBC, UA 0-5 0 - 5 WBC/hpf   Bacteria, UA RARE (A) NONE SEEN   Squamous Epithelial / LPF 0-5 0 - 5   Mucus PRESENT   Glucose, capillary     Status: Abnormal   Collection Time: 01/23/18 12:24 PM  Result Value Ref Range   Glucose-Capillary 100 (H) 65 - 99 mg/dL  CBC     Status: None   Collection Time: 01/23/18 12:30 PM  Result Value Ref Range   WBC 7.8 4.0 - 10.5 K/uL   RBC 4.06 3.87 - 5.11 MIL/uL   Hemoglobin 12.1 12.0 - 15.0 g/dL   HCT 16.1 09.6 - 04.5 %   MCV 90.6 78.0 - 100.0 fL   MCH 29.8 26.0 - 34.0 pg   MCHC 32.9 30.0 - 36.0 g/dL   RDW 40.9 81.1 - 91.4 %   Platelets 196 150 - 400 K/uL  Basic metabolic panel     Status: Abnormal   Collection Time: 01/23/18 12:30 PM  Result Value Ref Range   Sodium 135 135 - 145 mmol/L   Potassium 3.3 (L) 3.5 - 5.1 mmol/L   Chloride 104 101 - 111 mmol/L   CO2 21 (L) 22 - 32 mmol/L   Glucose, Bld 86 65 - 99 mg/dL   BUN 13 6 - 20 mg/dL   Creatinine, Ser 7.82 0.44 - 1.00 mg/dL   Calcium 8.6 (L) 8.9 - 10.3 mg/dL   GFR calc non Af Amer >60 >60 mL/min   GFR calc Af Amer >60 >60 mL/min   Anion gap 10 5 - 15    MDM NST:  Baseline: 135 bpm, Variability: Good {> 6 bpm), Accelerations: Reactive, Decelerations: Absent and no contractions  VSS. Pt not orthostatic. CBG 100.  Hemoglobin stable at 12.1 --- after blood draw, pt states she can't return to work on days she has her blood drawn d/t feeling weak whenever she has labs Pt asymptomatic while here in MAU  C/w Dr. Richardson Dopp. Ok to  discharge home. Can offer OTC meclizine if has dizziness with position changes.   Informed patient of normal labs and reassuring vital signs. Encouraged patient to increase water intake & eat frequent small meals. 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 Discussed with patient that sometimes we can give work restriction notes that typically include weight lifting restrictions & breaks every 2 hours, but that she would need to discuss this with Dr. Richardson Dopp at her next [facility] visit.  Assessment and Plan  A: 1. Lightheadedness   2. [redacted] weeks gestation of pregnancy    P: Discharge home Discussed use of OTC meclizine depending on symptoms Increase water intake, enough during the day for urine to be clear Eat every 2-3 hours Slow position changes Discussed reasons to return to MAU Keep scheduled appt with Dr. Richardson Dopp next week   Judeth Horn 01/23/2018, 12:14 PM

## 2018-03-09 LAB — OB RESULTS CONSOLE GBS: GBS: NEGATIVE

## 2018-03-25 ENCOUNTER — Other Ambulatory Visit: Payer: Self-pay | Admitting: Obstetrics and Gynecology

## 2018-03-27 ENCOUNTER — Encounter (HOSPITAL_COMMUNITY): Payer: Self-pay

## 2018-03-27 ENCOUNTER — Inpatient Hospital Stay (HOSPITAL_COMMUNITY): Payer: 59 | Admitting: Anesthesiology

## 2018-03-27 ENCOUNTER — Inpatient Hospital Stay (HOSPITAL_COMMUNITY)
Admission: RE | Admit: 2018-03-27 | Discharge: 2018-03-29 | DRG: 806 | Disposition: A | Discharge status: Home / Self Care | Payer: 59 | Source: Ambulatory Visit | Attending: Obstetrics and Gynecology | Admitting: Obstetrics and Gynecology

## 2018-03-27 DIAGNOSIS — D680 Von Willebrand's disease: Secondary | ICD-10-CM | POA: Diagnosis present

## 2018-03-27 DIAGNOSIS — Z3A39 39 weeks gestation of pregnancy: Secondary | ICD-10-CM

## 2018-03-27 DIAGNOSIS — Z3490 Encounter for supervision of normal pregnancy, unspecified, unspecified trimester: Secondary | ICD-10-CM

## 2018-03-27 DIAGNOSIS — O9912 Other diseases of the blood and blood-forming organs and certain disorders involving the immune mechanism complicating childbirth: Secondary | ICD-10-CM | POA: Diagnosis present

## 2018-03-27 LAB — CBC
HCT: 37.1 % (ref 36.0–46.0)
Hemoglobin: 12.1 g/dL (ref 12.0–15.0)
MCH: 29.6 pg (ref 26.0–34.0)
MCHC: 32.6 g/dL (ref 30.0–36.0)
MCV: 90.7 fL (ref 78.0–100.0)
Platelets: 193 10*3/uL (ref 150–400)
RBC: 4.09 MIL/uL (ref 3.87–5.11)
RDW: 13.2 % (ref 11.5–15.5)
WBC: 7.9 10*3/uL (ref 4.0–10.5)

## 2018-03-27 LAB — TYPE AND SCREEN
ABO/RH(D): O POS
ANTIBODY SCREEN: NEGATIVE

## 2018-03-27 LAB — APTT: aPTT: 33 seconds (ref 24–36)

## 2018-03-27 LAB — PROTIME-INR
INR: 0.9
Prothrombin Time: 12 seconds (ref 11.4–15.2)

## 2018-03-27 LAB — RPR: RPR Ser Ql: NONREACTIVE

## 2018-03-27 MED ORDER — DIPHENHYDRAMINE HCL 50 MG/ML IJ SOLN: 12.5000 mg | INTRAMUSCULAR | Status: DC | PRN

## 2018-03-27 MED ORDER — IBUPROFEN 600 MG PO TABS
600.0000 mg | ORAL_TABLET | Freq: Four times a day (QID) | ORAL | Status: DC
  Administered 2018-03-28 – 2018-03-29 (×5): 600 mg via ORAL
  Filled 2018-03-27 (×7): qty 1

## 2018-03-27 MED ORDER — DIBUCAINE 1 % RE OINT: 1.0000 "application " | TOPICAL_OINTMENT | RECTAL | Status: DC | PRN

## 2018-03-27 MED ORDER — EPHEDRINE 5 MG/ML INJ
10.0000 mg | INTRAVENOUS | Status: DC | PRN
  Filled 2018-03-27: qty 2

## 2018-03-27 MED ORDER — ACETAMINOPHEN 325 MG PO TABS
650.0000 mg | ORAL_TABLET | ORAL | Status: DC | PRN
Start: 2018-03-27 — End: 2018-03-29
  Administered 2018-03-28: 650 mg via ORAL
  Filled 2018-03-27: qty 2

## 2018-03-27 MED ORDER — COCONUT OIL OIL
1.0000 "application " | TOPICAL_OIL | Status: DC | PRN
  Administered 2018-03-28: 1 via TOPICAL
  Filled 2018-03-27: qty 120

## 2018-03-27 MED ORDER — ZOLPIDEM TARTRATE 5 MG PO TABS
5.0000 mg | ORAL_TABLET | Freq: Every evening | ORAL | Status: DC | PRN
Start: 2018-03-27 — End: 2018-03-29

## 2018-03-27 MED ORDER — OXYTOCIN BOLUS FROM INFUSION
500.0000 mL | Freq: Once | INTRAVENOUS | Status: AC
  Administered 2018-03-27: 500 mL via INTRAVENOUS

## 2018-03-27 MED ORDER — LIDOCAINE HCL (PF) 1 % IJ SOLN
INTRAMUSCULAR | Status: DC | PRN
  Administered 2018-03-27: 8 mL via EPIDURAL

## 2018-03-27 MED ORDER — OXYTOCIN 40 UNITS IN LACTATED RINGERS INFUSION - SIMPLE MED
1.0000 m[IU]/min | INTRAVENOUS | Status: DC
  Administered 2018-03-27: 2 m[IU]/min via INTRAVENOUS

## 2018-03-27 MED ORDER — TERBUTALINE SULFATE 1 MG/ML IJ SOLN: 0.2500 mg | Freq: Once | INTRAMUSCULAR | Status: DC | PRN

## 2018-03-27 MED ORDER — PHENYLEPHRINE 40 MCG/ML (10ML) SYRINGE FOR IV PUSH (FOR BLOOD PRESSURE SUPPORT)
80.0000 ug | PREFILLED_SYRINGE | INTRAVENOUS | Status: DC | PRN
  Filled 2018-03-27: qty 10
  Filled 2018-03-27: qty 5

## 2018-03-27 MED ORDER — LACTATED RINGERS IV SOLN
500.0000 mL | Freq: Once | INTRAVENOUS | Status: AC
  Administered 2018-03-27: 1000 mL via INTRAVENOUS

## 2018-03-27 MED ORDER — MISOPROSTOL 200 MCG PO TABS
ORAL_TABLET | ORAL | Status: AC
  Filled 2018-03-27: qty 1

## 2018-03-27 MED ORDER — WITCH HAZEL-GLYCERIN EX PADS: 1.0000 "application " | MEDICATED_PAD | CUTANEOUS | Status: DC | PRN

## 2018-03-27 MED ORDER — ONDANSETRON HCL 4 MG PO TABS: 4.0000 mg | ORAL_TABLET | ORAL | Status: DC | PRN

## 2018-03-27 MED ORDER — LACTATED RINGERS IV SOLN: 500.0000 mL | INTRAVENOUS | Status: DC | PRN

## 2018-03-27 MED ORDER — TERBUTALINE SULFATE 1 MG/ML IJ SOLN
0.2500 mg | Freq: Once | INTRAMUSCULAR | Status: DC | PRN
  Filled 2018-03-27: qty 1

## 2018-03-27 MED ORDER — SOD CITRATE-CITRIC ACID 500-334 MG/5ML PO SOLN: 30.0000 mL | ORAL | Status: DC | PRN

## 2018-03-27 MED ORDER — OXYTOCIN 40 UNITS IN LACTATED RINGERS INFUSION - SIMPLE MED
2.5000 [IU]/h | INTRAVENOUS | Status: DC
  Filled 2018-03-27: qty 1000

## 2018-03-27 MED ORDER — BENZOCAINE-MENTHOL 20-0.5 % EX AERO
1.0000 "application " | INHALATION_SPRAY | CUTANEOUS | Status: DC | PRN
  Administered 2018-03-28: 1 via TOPICAL
  Filled 2018-03-27: qty 56

## 2018-03-27 MED ORDER — OXYCODONE-ACETAMINOPHEN 5-325 MG PO TABS: 1.0000 | ORAL_TABLET | ORAL | Status: DC | PRN

## 2018-03-27 MED ORDER — PHENYLEPHRINE 40 MCG/ML (10ML) SYRINGE FOR IV PUSH (FOR BLOOD PRESSURE SUPPORT)
80.0000 ug | PREFILLED_SYRINGE | INTRAVENOUS | Status: DC | PRN
  Filled 2018-03-27: qty 5

## 2018-03-27 MED ORDER — DIPHENHYDRAMINE HCL 25 MG PO CAPS: 25.0000 mg | ORAL_CAPSULE | Freq: Four times a day (QID) | ORAL | Status: DC | PRN

## 2018-03-27 MED ORDER — ONDANSETRON HCL 4 MG/2ML IJ SOLN
4.0000 mg | Freq: Four times a day (QID) | INTRAMUSCULAR | Status: DC | PRN
  Administered 2018-03-27: 4 mg via INTRAVENOUS
  Filled 2018-03-27: qty 2

## 2018-03-27 MED ORDER — SIMETHICONE 80 MG PO CHEW: 80.0000 mg | CHEWABLE_TABLET | ORAL | Status: DC | PRN

## 2018-03-27 MED ORDER — OXYCODONE-ACETAMINOPHEN 5-325 MG PO TABS: 2.0000 | ORAL_TABLET | ORAL | Status: DC | PRN

## 2018-03-27 MED ORDER — LIDOCAINE HCL (PF) 1 % IJ SOLN
30.0000 mL | INTRAMUSCULAR | Status: DC | PRN
  Filled 2018-03-27: qty 30

## 2018-03-27 MED ORDER — LACTATED RINGERS IV SOLN
INTRAVENOUS | Status: DC
  Administered 2018-03-27 (×3): via INTRAVENOUS

## 2018-03-27 MED ORDER — DESMOPRESSIN ACETATE 1.5 MG/ML NA SOLN: 2.0000 | Freq: Two times a day (BID) | NASAL | Status: DC | PRN

## 2018-03-27 MED ORDER — FENTANYL 2.5 MCG/ML BUPIVACAINE 1/10 % EPIDURAL INFUSION (WH - ANES)
14.0000 mL/h | INTRAMUSCULAR | Status: DC | PRN
  Administered 2018-03-27: 14 mL/h via EPIDURAL
  Filled 2018-03-27: qty 100

## 2018-03-27 MED ORDER — FENTANYL CITRATE (PF) 100 MCG/2ML IJ SOLN: 50.0000 ug | INTRAMUSCULAR | Status: DC | PRN

## 2018-03-27 MED ORDER — MISOPROSTOL 200 MCG PO TABS
ORAL_TABLET | ORAL | Status: AC
  Filled 2018-03-27: qty 5

## 2018-03-27 MED ORDER — METHYLERGONOVINE MALEATE 0.2 MG/ML IJ SOLN
INTRAMUSCULAR | Status: AC
  Filled 2018-03-27: qty 1

## 2018-03-27 MED ORDER — PRENATAL MULTIVITAMIN CH
1.0000 | ORAL_TABLET | Freq: Every day | ORAL | Status: DC
  Administered 2018-03-28: 1 via ORAL
  Filled 2018-03-27: qty 1

## 2018-03-27 MED ORDER — ACETAMINOPHEN 325 MG PO TABS: 650.0000 mg | ORAL_TABLET | ORAL | Status: DC | PRN

## 2018-03-27 MED ORDER — METHYLERGONOVINE MALEATE 0.2 MG PO TABS: 0.2000 mg | ORAL_TABLET | ORAL | Status: DC | PRN

## 2018-03-27 MED ORDER — SENNOSIDES-DOCUSATE SODIUM 8.6-50 MG PO TABS
2.0000 | ORAL_TABLET | ORAL | Status: DC
  Administered 2018-03-28 – 2018-03-29 (×2): 2 via ORAL
  Filled 2018-03-27 (×2): qty 2

## 2018-03-27 MED ORDER — METHYLERGONOVINE MALEATE 0.2 MG/ML IJ SOLN: 0.2000 mg | INTRAMUSCULAR | Status: DC | PRN

## 2018-03-27 MED ORDER — MISOPROSTOL 25 MCG QUARTER TABLET
25.0000 ug | ORAL_TABLET | ORAL | Status: DC | PRN
  Administered 2018-03-27 (×2): 25 ug via VAGINAL
  Filled 2018-03-27 (×3): qty 1

## 2018-03-27 MED ORDER — SODIUM CHLORIDE 0.9 % IV SOLN
20.0000 ug | Freq: Once | INTRAVENOUS | Status: AC | PRN
  Administered 2018-03-27: 20 ug via INTRAVENOUS
  Filled 2018-03-27: qty 5

## 2018-03-27 MED ORDER — ONDANSETRON HCL 4 MG/2ML IJ SOLN: 4.0000 mg | INTRAMUSCULAR | Status: DC | PRN

## 2018-03-27 NOTE — Anesthesia Procedure Notes (Signed)
Epidural Patient location during procedure: OB Start time: 03/27/2018 5:30 PM End time: 03/27/2018 5:35 PM  Staffing Anesthesiologist: Bethena Midgetddono, Taima Rada, MD  Preanesthetic Checklist Completed: patient identified, site marked, surgical consent, pre-op evaluation, timeout performed, IV checked, risks and benefits discussed and monitors and equipment checked  Epidural Patient position: sitting Prep: site prepped and draped and DuraPrep Patient monitoring: continuous pulse ox and blood pressure Approach: midline Location: L4-L5 Injection technique: LOR air  Needle:  Needle type: Tuohy  Needle gauge: 17 G Needle length: 9 cm and 9 Needle insertion depth: 8 cm Catheter type: closed end flexible Catheter size: 19 Gauge Catheter at skin depth: 13 cm Test dose: negative  Assessment Events: blood not aspirated, injection not painful, no injection resistance, negative IV test and no paresthesia

## 2018-03-27 NOTE — Anesthesia Pain Management Evaluation Note (Signed)
  CRNA Pain Management Visit Note  Patient: Karla [name], 27 y.o., female  "Hello I am a member of the anesthesia team at [facility]. We have an anesthesia team available at all times to provide care throughout the hospital, including epidural management and anesthesia for C-section. I don't know your plan for the delivery whether it a natural birth, water birth, IV sedation, nitrous supplementation, doula or epidural, but we want to meet your pain goals."   1.Was your pain managed to your expectations on prior hospitalizations?   Yes   2.What is your expectation for pain management during this hospitalization?     Epidural  3.How can we help you reach that goal? support  Record the patient's initial score and the patient's pain goal.   Pain: 2  Pain Goal: 8 The [facility] wants you to be able to say your pain was always managed very well.  Trellis PaganiniBREWER,Demyan Fugate N 03/27/2018

## 2018-03-27 NOTE — Progress Notes (Signed)
  Subjective: Rates contractions as 7 out of 10.   Objective: BP 113/75   Pulse 67   Temp (!) 97.4 F (36.3 C) (Oral)   Resp 20   Ht 5\' 2"  (1.575 m)   Wt 67.7 kg (149 lb 3.2 oz)   SpO2 100%   BMI 27.29 kg/m  No intake/output data recorded. No intake/output data recorded.  FHT:  FHR: 125 bpm, variability: moderate,  accelerations:  Present,  decelerations:  Absent UC:   regular, every 2-3 minutes SVE: 3/80/-3 Labs: Lab Results  Component Value Date   WBC 7.9 03/27/2018   HGB 12.1 03/27/2018   HCT 37.1 03/27/2018   MCV 90.7 03/27/2018   PLT 193 03/27/2018    Assessment / Plan: Induction of labor due to term with favorable cervix,  progressing well on pitocin  Labor: Progressing on Pitocin, will continue to increase then AROM Preeclampsia:  NA Fetal Wellbeing:  Category I Pain Control:  planning epidural  I/D:  n/a Anticipated MOD:  NSVD   Von willebrands disease plan ddavp once complete   Jaquetta Currier J. 03/27/2018, 6:49 PM

## 2018-03-27 NOTE — Anesthesia Preprocedure Evaluation (Signed)
Anesthesia Evaluation  Patient identified by MRN, date of birth, ID band Patient awake    Reviewed: Allergy & Precautions, H&P , NPO status , Patient's Chart, lab work & pertinent test results, reviewed documented beta blocker date and time   Airway Mallampati: II  TM Distance: >3 FB Neck ROM: full    Dental no notable dental hx.    Pulmonary neg pulmonary ROS,    Pulmonary exam normal breath sounds clear to auscultation       Cardiovascular negative cardio ROS Normal cardiovascular exam Rhythm:regular Rate:Normal     Neuro/Psych negative neurological ROS  negative psych ROS   GI/Hepatic negative GI ROS, Neg liver ROS,   Endo/Other  negative endocrine ROS  Renal/GU negative Renal ROS  negative genitourinary   Musculoskeletal   Abdominal   Peds  Hematology  (+) FACTOR VIII (HEMOPHILIA) ,   Anesthesia Other Findings   Reproductive/Obstetrics (+) Pregnancy                             Anesthesia Physical Anesthesia Plan  ASA: III  Anesthesia Plan: Epidural   Post-op Pain Management:    Induction:   PONV Risk Score and Plan:   Airway Management Planned:   Additional Equipment:   Intra-op Plan:   Post-operative Plan:   Informed Consent: I have reviewed the patients History and Physical, chart, labs and discussed the procedure including the risks, benefits and alternatives for the proposed anesthesia with the patient or authorized representative who has indicated his/her understanding and acceptance.     Plan Discussed with:   Anesthesia Plan Comments:         Anesthesia Quick Evaluation

## 2018-03-27 NOTE — Progress Notes (Signed)
  Subjective: Pt is comfortable with her epidural .   Objective: BP 113/75   Pulse 67   Temp (!) 97.4 F (36.3 C) (Oral)   Resp 20   Ht 5\' 2"  (1.575 m)   Wt 67.7 kg (149 lb 3.2 oz)   SpO2 100%   BMI 27.29 kg/m  No intake/output data recorded. No intake/output data recorded.  FHT:  FHR: 120 bpm, variability: moderate,  accelerations:  Present,  decelerations:  Absent UC:   irregular, every 2-5 minutes SVE:   Dilation: 5 Effacement (%): 80 Station: -2 Exam by:: Dr. Richardson Doppole AROM clear fluid  Labs: Lab Results  Component Value Date   WBC 7.9 03/27/2018   HGB 12.1 03/27/2018   HCT 37.1 03/27/2018   MCV 90.7 03/27/2018   PLT 193 03/27/2018    Assessment / Plan: Induction of labor due to term with favorable cervix,  progressing well on pitocin  Labor: Progressing normally Preeclampsia:  NA Fetal Wellbeing:  Category I Pain Control:  Epidural I/D:  n/a Anticipated MOD:  NSVD  Karla [name] J. 03/27/2018, 6:51 PM

## 2018-03-27 NOTE — H&P (Signed)
Karla [name] is a 27 y.o. female G2 P1001 at 39 wks and 4 days  presenting for elective induction of labor at term. Pregnancy complicated by Type 1 vonwillbrands disease.  +FM no leakage. Of fluid. occational contraction. Prenatal care provided by Dr. Richardson Doppole with [name].    OB History    Gravida  2   Para  1   Term  1   Preterm  0   AB  0   Living  1     SAB  0   TAB  0   Ectopic  0   Multiple  0   Live Births  1          Past Medical History:  Diagnosis Date  . Von Willebrand disease (HCC)    Past Surgical History:  Procedure Laterality Date  . WISDOM TOOTH EXTRACTION Bilateral    Family History: family history is not on file. Social History:  reports that she has never smoked. She has never used smokeless tobacco. She reports that she does not drink alcohol or use drugs.     Maternal Diabetes: No Genetic Screening: Normal Maternal Ultrasounds/Referrals: Normal Fetal Ultrasounds or other Referrals:  None Maternal Substance Abuse:  No Significant Maternal Medications:  None Significant Maternal Lab Results:  Lab values include: Group B Strep negative Other Comments:  None  Review of Systems  Constitutional: Negative.   HENT: Negative.   Eyes: Negative.   Respiratory: Negative.   Cardiovascular: Negative.   Gastrointestinal: Negative.   Genitourinary: Negative.   Musculoskeletal: Negative.   Skin: Negative.   Neurological: Negative.   Endo/Heme/Allergies: Negative.   Psychiatric/Behavioral: Negative.    History Dilation: 2 Effacement (%): 70 Station: -2 Exam by:: Enis SlipperJane Bailey, RN Blood pressure 120/86, pulse 68, temperature (!) 97.5 F (36.4 C), temperature source Oral, resp. rate 16, height 5\' 2"  (1.575 m), weight 67.7 kg (149 lb 3.2 oz), unknown if currently breastfeeding. Maternal Exam:  Abdomen: Patient reports no abdominal tenderness. Estimated fetal weight is 7 lbs 7 oz .   Fetal presentation: vertex  Introitus: Normal vulva.    Fetal Exam Fetal Monitor Review: Baseline rate: 125.  Variability: moderate (6-25 bpm).   Pattern: accelerations present and no decelerations.    Fetal State Assessment: Category I - tracings are normal.     Physical Exam  Vitals reviewed. Constitutional: She is oriented to person, place, and time. She appears well-developed and well-nourished.  HENT:  Head: Normocephalic and atraumatic.  Eyes: Pupils are equal, round, and reactive to light. Conjunctivae are normal.  Neck: Normal range of motion. Neck supple.  Cardiovascular: Normal rate and regular rhythm.  Respiratory: Effort normal and breath sounds normal.  GI: There is no tenderness.  Genitourinary: Vagina normal.  Musculoskeletal: Normal range of motion. She exhibits no edema.  Neurological: She is alert and oriented to person, place, and time.  Skin: Skin is warm and dry.  Psychiatric: She has a normal mood and affect.    Prenatal labs: ABO, Rh: --/--/O POS (07/15 0148) Antibody: NEG (07/15 0148) Rubella: Immune (12/12 0000) RPR: Nonreactive (04/18 0000)  HBsAg: Negative (12/12 0000)  HIV: Non-reactive (12/12 0000)  GBS: Negative (06/27 0000)   Assessment/Plan: 39 wks and 4 days for elective induction  Received cytotec for cervical ripening overnight. Plan to start pitocin at 1010 am  Von willebrands disease- plan DDAVP just prior to delivery - per patients hematologist she has low risk for bleeding.  Anticipate SVD   Teigan Sahli J.  03/27/2018, 1:35 PM

## 2018-03-28 ENCOUNTER — Inpatient Hospital Stay (HOSPITAL_COMMUNITY): Admission: RE | Admit: 2018-03-28 | Payer: 59 | Source: Ambulatory Visit | Admitting: Obstetrics and Gynecology

## 2018-03-28 LAB — CBC
HCT: 35.4 % — ABNORMAL LOW (ref 36.0–46.0)
HEMOGLOBIN: 11.8 g/dL — AB (ref 12.0–15.0)
MCH: 30.3 pg (ref 26.0–34.0)
MCHC: 33.3 g/dL (ref 30.0–36.0)
MCV: 90.8 fL (ref 78.0–100.0)
PLATELETS: 162 10*3/uL (ref 150–400)
RBC: 3.9 MIL/uL (ref 3.87–5.11)
RDW: 13.1 % (ref 11.5–15.5)
WBC: 8.5 10*3/uL (ref 4.0–10.5)

## 2018-03-28 NOTE — Lactation Note (Signed)
This note was copied from a baby's chart. Lactation Consultation Note  Patient Name: Boy Shanda BumpsUlemu Kitchings ZOXWR'UToday's Date: 03/28/2018   Attempted to see mom for first LC visit. Photographer and Pediatrician on the room. LC Brochure left in the room. LC will return at a later time.      Maternal Data    Feeding Feeding Type: Breast Fed Length of feed: 15 min  LATCH Score                   Interventions    Lactation Tools Discussed/Used     Consult Status      Silas FloodSharon S Bedelia Pong 03/28/2018, 1:17 PM

## 2018-03-28 NOTE — Progress Notes (Signed)
After two lab attempts to draw pts morning labs, pt requested no more attempts right now. She wants to wait until later today. This RN was notified.

## 2018-03-28 NOTE — Lactation Note (Signed)
This note was copied from a baby's chart. Lactation Consultation Note  Patient Name: [name] EAVWU'JToday's Date: 03/28/2018 Reason for consult: Initial assessment;Term Breastfeeding consultation services and support information given and reviewed.  This is mom's second baby and she breastfed her first for one year.  Newborn is 1717 hours old and has been feeding well.  Baby currently at breast and active suck/swallows noted.  Instructed to use breast massage and compression during feeding.  Discussed feeding with cues but mom states she does better with a 2 hour schedule.  Encouraged to feed with cues.  Instructed to call for concerns/assist prn.  Maternal Data Has patient been taught Hand Expression?: Yes Does the patient have breastfeeding experience prior to this delivery?: Yes  Feeding    LATCH Score                   Interventions    Lactation Tools Discussed/Used     Consult Status Consult Status: Follow-up Date: 03/29/18 Follow-up type: In-patient    Huston FoleyMOULDEN, Chrishonda Hesch S 03/28/2018, 2:08 PM

## 2018-03-28 NOTE — Anesthesia Postprocedure Evaluation (Signed)
Anesthesia Post Note  Patient: Karla [name]  Procedure(s) Performed: AN AD HOC LABOR EPIDURAL     Patient location during evaluation: Mother Baby Anesthesia Type: Epidural Level of consciousness: awake and alert and oriented Pain management: satisfactory to patient Vital Signs Assessment: post-procedure vital signs reviewed and stable Respiratory status: spontaneous breathing and nonlabored ventilation Cardiovascular status: stable Postop Assessment: no headache, no backache, no signs of nausea or vomiting, adequate PO intake, patient able to bend at knees and able to ambulate (patient up walking) Anesthetic complications: no    Last Vitals:  Vitals:   03/28/18 0018 03/28/18 0300  BP: 112/79 112/69  Pulse: 73 64  Resp: 18 18  Temp: 36.6 C 36.7 C  SpO2:  98%    Last Pain:  Vitals:   03/28/18 0551  TempSrc:   PainSc: 3    Pain Goal: Patients Stated Pain Goal: 8 (03/27/18 0910)               Madison HickmanGREGORY,Karem Tomaso

## 2018-03-28 NOTE — Progress Notes (Signed)
Post Partum Day 1 Subjective: no complaints, up ad lib and tolerating PO  Objective: Blood pressure 112/69, pulse 64, temperature 98 F (36.7 C), temperature source Oral, resp. rate 18, height 5\' 2"  (1.575 m), weight 67.7 kg (149 lb 3.2 oz), SpO2 98 %, unknown if currently breastfeeding.  Physical Exam:  General: alert, cooperative and no distress Lochia: appropriate Uterine Fundus: firm Incision: NA DVT Evaluation: No evidence of DVT seen on physical exam.  Recent Labs    03/27/18 0148  HGB 12.1  HCT 37.1    Assessment/Plan: Plan for discharge tomorrow and Circumcision prior to discharge  Routine postpartum care    LOS: 1 day   Arrian Manson J. 03/28/2018, 7:59 AM

## 2018-03-29 MED ORDER — IBUPROFEN 600 MG PO TABS: 600.0000 mg | ORAL_TABLET | Freq: Four times a day (QID) | ORAL | 1 refills | Status: DC | PRN

## 2018-03-29 NOTE — Discharge Summary (Signed)
OB Discharge Summary     Patient Name: Karla [name] DOB: [birth year] MRN: [id number]  Date of admission: 03/27/2018 Delivering MD: Gerald LeitzOLE, Juanell Saffo   Date of discharge: 03/29/2018  Admitting diagnosis: INDUCTION Intrauterine pregnancy: 2527w4d     Secondary diagnosis:  Active Problems:   Term pregnancy Von willebrands disease  Additional problems: None     Discharge diagnosis: Term Pregnancy Delivered                                                                                                Post partum procedures:None  Augmentation: AROM, Pitocin and Cytotec  Complications: None  Hospital course:  Induction of Labor With Vaginal Delivery   27 y.o. yo [id number] at 1327w4d was admitted to the hospital 03/27/2018 for induction of labor.  Indication for induction: Favorable cervix at term.  Patient had an uncomplicated labor course as follows: Membrane Rupture Time/Date: 6:38 PM ,03/27/2018   Intrapartum Procedures: Episiotomy: None [1]                                         Lacerations:  1st degree [2];Perineal [11]  Patient had delivery of a Viable infant.  Information for the patient's newborn:  Karla [name], Boy Aryanna [[id number]][[id number]]  Delivery Method: Vaginal, Spontaneous(Filed from Delivery Summary)   03/27/2018  Details of delivery can be found in separate delivery note.  Patient had a routine postpartum course. Patient is discharged home 03/29/18.  Physical exam  Vitals:   03/28/18 1154 03/28/18 1307 03/28/18 2118 03/29/18 0533  BP: 120/76 121/88 122/78 120/70  Pulse: 75 70 72 80  Resp: 18 16 18 16   Temp: 98 F (36.7 C) 97.7 F (36.5 C) 98.2 F (36.8 C) 97.9 F (36.6 C)  TempSrc: Oral Oral Oral   SpO2:      Weight:      Height:       General: alert, cooperative and no distress Lochia: appropriate Uterine Fundus: firm Incision: N/A DVT Evaluation: No evidence of DVT seen on physical exam. Labs: Lab Results  Component Value Date   WBC 8.5 03/28/2018   HGB 11.8 (L)  03/28/2018   HCT 35.4 (L) 03/28/2018   MCV 90.8 03/28/2018   PLT 162 03/28/2018   CMP Latest Ref Rng & Units 01/23/2018  Glucose 65 - 99 mg/dL 86  BUN 6 - 20 mg/dL 13  Creatinine 1.300.44 - 8.651.00 mg/dL 7.840.47  Sodium 696135 - 295145 mmol/L 135  Potassium 3.5 - 5.1 mmol/L 3.3(L)  Chloride 101 - 111 mmol/L 104  CO2 22 - 32 mmol/L 21(L)  Calcium 8.9 - 10.3 mg/dL 2.8(U8.6(L)  Total Protein 6.5 - 8.1 g/dL -  Total Bilirubin 0.3 - 1.2 mg/dL -  Alkaline Phos 38 - 132126 U/L -  AST 15 - 41 U/L -  ALT 14 - 54 U/L -    Discharge instruction: per After Visit Summary and "Baby and Me Booklet".  After visit meds:  Allergies as of 03/29/2018   No Known Allergies  Medication List    TAKE these medications   ibuprofen 600 MG tablet Commonly known as:  ADVIL,MOTRIN Take 1 tablet (600 mg total) by mouth every 6 (six) hours as needed.   multivitamin-prenatal 27-0.8 MG Tabs tablet Take 1 tablet by mouth daily at 12 noon.   ondansetron 4 MG tablet Commonly known as:  ZOFRAN Take 1 tablet (4 mg total) by mouth every 6 (six) hours.   VITAMIN D (CHOLECALCIFEROL) PO Take 500 Units by mouth daily.       Diet: routine diet  Activity: Advance as tolerated. Pelvic rest for 6 weeks.   Outpatient follow up:6 weeks Follow up Appt:No future appointments. Follow up Visit:No follow-ups on file.  Postpartum contraception: Not Discussed  Newborn Data: Live born female  Birth Weight: 6 lb 13.5 oz (3104 g) APGAR: 9, 9  Newborn Delivery   Birth date/time:  03/27/2018 20:34:00 Delivery type:  Vaginal, Spontaneous     Baby Feeding: Breast Disposition:home with mother   03/29/2018 Jessee Avers., MD

## 2018-03-29 NOTE — Lactation Note (Signed)
This note was copied from a baby's chart. Lactation Consultation Note  Patient Name: Karla [name] [id number]'s Date: 03/29/2018 Reason for consult: Follow-up assessment;Term;Other (Comment)(P2/ exp breast feeder )  Baby is 8039 hours old  LC reviewed and updated doc flow sheets and updated. Per mom breast feeding is going well and feels comfortable with feedings Sore nipple and engorgement prevention and tx reviewed.  Mom already has hand pump .  Mother informed of post-discharge support and given phone number to the lactation department, including services for phone call assistance; out-patient appointments; and breastfeeding support group. List of other breastfeeding resources in the community given in the handout. Encouraged mother to call for problems or concerns related to breastfeeding.  Maternal Data Has patient been taught Hand Expression?: Yes  Feeding Feeding Type: (last fed at 1000) Length of feed: 15 min  LATCH Score                   Interventions Interventions: Breast feeding basics reviewed  Lactation Tools Discussed/Used Tools: Pump Breast pump type: Manual WIC Program: No Pump Review: Milk Storage   Consult Status Consult Status: Complete Date: 03/29/18    Kathrin GreathouseMargaret Ann Waleed Dettman 03/29/2018, 12:22 PM

## 2019-05-16 ENCOUNTER — Other Ambulatory Visit: Payer: Self-pay | Admitting: Obstetrics and Gynecology

## 2019-05-16 ENCOUNTER — Other Ambulatory Visit (HOSPITAL_COMMUNITY)
Admission: RE | Admit: 2019-05-16 | Discharge: 2019-05-16 | Disposition: A | Discharge status: Home / Self Care | Payer: 59 | Source: Ambulatory Visit | Attending: Obstetrics and Gynecology | Admitting: Obstetrics and Gynecology

## 2019-05-16 DIAGNOSIS — Z01419 Encounter for gynecological examination (general) (routine) without abnormal findings: Secondary | ICD-10-CM | POA: Insufficient documentation

## 2019-05-17 LAB — CYTOLOGY - PAP
Chlamydia: NEGATIVE
Diagnosis: NEGATIVE
Neisseria Gonorrhea: NEGATIVE

## 2023-03-08 ENCOUNTER — Other Ambulatory Visit: Payer: Self-pay

## 2023-03-08 ENCOUNTER — Encounter (HOSPITAL_COMMUNITY): Payer: Self-pay | Admitting: *Deleted

## 2023-03-08 ENCOUNTER — Ambulatory Visit (HOSPITAL_COMMUNITY)
Admission: EM | Admit: 2023-03-08 | Discharge: 2023-03-08 | Disposition: A | Discharge status: Home / Self Care | Payer: Managed Care, Other (non HMO) | Attending: Family Medicine | Admitting: Family Medicine

## 2023-03-08 DIAGNOSIS — R1110 Vomiting, unspecified: Secondary | ICD-10-CM | POA: Diagnosis not present

## 2023-03-08 LAB — POCT URINALYSIS DIP (MANUAL ENTRY)
Bilirubin, UA: NEGATIVE
Blood, UA: NEGATIVE
Glucose, UA: NEGATIVE mg/dL
Ketones, POC UA: NEGATIVE mg/dL
Nitrite, UA: NEGATIVE
Protein Ur, POC: 30 mg/dL — AB
Spec Grav, UA: 1.025 (ref 1.010–1.025)
Urobilinogen, UA: 1 E.U./dL
pH, UA: 6.5 (ref 5.0–8.0)

## 2023-03-08 LAB — POCT URINE PREGNANCY: Preg Test, Ur: NEGATIVE

## 2023-03-08 MED ORDER — ONDANSETRON 4 MG PO TBDP: 4.0000 mg | ORAL_TABLET | Freq: Three times a day (TID) | ORAL | 0 refills | Status: AC | PRN

## 2023-03-08 NOTE — ED Triage Notes (Signed)
Pt woke up vomiting and went to work and felt worse. Pt unsure if possible pregnancy . Pt reports her son has also had a upset stomach.

## 2023-03-08 NOTE — ED Provider Notes (Signed)
[facility]   [id number] 03/08/23 Arrival Time: 1244  ASSESSMENT & PLAN:  1. Vomiting, unspecified vomiting type, unspecified whether nausea present    Only one episode of emesis. Benign abdomen. Tolerating PO fluids. U/A without significant abnormalities. UPT negative.  If needed: Meds ordered this encounter  Medications   ondansetron (ZOFRAN-ODT) 4 MG disintegrating tablet    Sig: Take 1 tablet (4 mg total) by mouth every 8 (eight) hours as needed for nausea or vomiting.    Dispense:  15 tablet    Refill:  0   May return as needed.  Reviewed expectations re: course of current medical issues. Questions answered. Outlined signs and symptoms indicating need for more acute intervention. Patient verbalized understanding. After Visit Summary given.   SUBJECTIVE: History from: patient.  Karla [name] is a 32 y.o. female who presents with complaint of non-bilious, non-bloody single episode of emesis; this am; slight nausea now. Denies abd pain. Normal bowel/bladder habits. Denies fever.  Patient's last menstrual period was 02/11/2023.  Past Surgical History:  Procedure Laterality Date   WISDOM TOOTH EXTRACTION Bilateral     OBJECTIVE:  Vitals:   03/08/23 1348  BP: 123/82  Pulse: 79  Resp: 18  Temp: 98.2 F (36.8 C)  SpO2: 98%    General appearance: alert; no distress Oropharynx: moist Abdomen: soft; non-distended; no TTP Back: no CVA tenderness Extremities: no edema; symmetrical with no gross deformities Skin: warm; dry Neurologic: normal gait Psychological: alert and cooperative; normal mood and affect  Labs: Results for orders placed or performed during the hospital encounter of 03/08/23  POC urinalysis dipstick  Result Value Ref Range   Color, UA yellow yellow   Clarity, UA clear clear   Glucose, UA negative negative mg/dL   Bilirubin, UA negative negative   Ketones, POC UA negative negative mg/dL   Spec Grav, UA 4.098 1.191 - 1.025    Blood, UA negative negative   pH, UA 6.5 5.0 - 8.0   Protein Ur, POC =30 (A) negative mg/dL   Urobilinogen, UA 1.0 0.2 or 1.0 E.U./dL   Nitrite, UA Negative Negative   Leukocytes, UA Small (1+) (A) Negative  POCT urine pregnancy  Result Value Ref Range   Preg Test, Ur Negative Negative   Labs Reviewed  POCT URINALYSIS DIP (MANUAL ENTRY) - Abnormal; Notable for the following components:      Result Value   Protein Ur, POC =30 (*)    Leukocytes, UA Small (1+) (*)    All other components within normal limits  POCT URINE PREGNANCY    No Known Allergies                                             Past Medical History:  Diagnosis Date   Von Willebrand disease (HCC)    Social History   Socioeconomic History   Marital status: Single    Spouse name: Not on file   Number of children: Not on file   Years of education: Not on file   Highest education level: Not on file  Occupational History   Not on file  Tobacco Use   Smoking status: Never   Smokeless tobacco: Never  Substance and Sexual Activity   Alcohol use: No   Drug use: No   Sexual activity: Yes  Other Topics Concern   Not on file  Social History Narrative  Not on file   Social Determinants of Health   Financial Resource Strain: Not on file  Food Insecurity: Not on file  Transportation Needs: Not on file  Physical Activity: Not on file  Stress: Not on file  Social Connections: Not on file  Intimate Partner Violence: Not on file   History reviewed. No pertinent family history.    Mardella Layman, MD 03/08/23 [phone]
